# Patient Record
Sex: Male | Born: 1945 | Race: White | Hispanic: No | Marital: Married | Smoking: Never smoker
Health system: Southern US, Community
[De-identification: ages and names within clinical notes are randomized; demographics above are authoritative.]

## PROBLEM LIST (undated history)

## (undated) DIAGNOSIS — C61 Malignant neoplasm of prostate: Secondary | ICD-10-CM

## (undated) DIAGNOSIS — Z950 Presence of cardiac pacemaker: Secondary | ICD-10-CM

## (undated) DIAGNOSIS — R55 Syncope and collapse: Secondary | ICD-10-CM

## (undated) DIAGNOSIS — I10 Essential (primary) hypertension: Secondary | ICD-10-CM

## (undated) DIAGNOSIS — K219 Gastro-esophageal reflux disease without esophagitis: Secondary | ICD-10-CM

## (undated) DIAGNOSIS — E785 Hyperlipidemia, unspecified: Secondary | ICD-10-CM

## (undated) DIAGNOSIS — M1990 Unspecified osteoarthritis, unspecified site: Secondary | ICD-10-CM

## (undated) HISTORY — DX: Syncope and collapse: R55

## (undated) HISTORY — DX: Hyperlipidemia, unspecified: E78.5

## (undated) HISTORY — PX: KNEE ARTHROSCOPY: SUR90

## (undated) HISTORY — PX: INGUINAL HERNIA REPAIR: SUR1180

## (undated) HISTORY — DX: Malignant neoplasm of prostate: C61

## (undated) HISTORY — DX: Gastro-esophageal reflux disease without esophagitis: K21.9

---

## 2008-05-02 ENCOUNTER — Encounter (INDEPENDENT_AMBULATORY_CARE_PROVIDER_SITE_OTHER): Payer: Self-pay | Admitting: Urology

## 2012-12-28 ENCOUNTER — Ambulatory Visit (INDEPENDENT_AMBULATORY_CARE_PROVIDER_SITE_OTHER): Payer: Medicare Other | Admitting: Urology

## 2012-12-28 DIAGNOSIS — N400 Enlarged prostate without lower urinary tract symptoms: Secondary | ICD-10-CM

## 2013-01-04 ENCOUNTER — Other Ambulatory Visit: Payer: Self-pay | Admitting: Urology

## 2013-01-04 DIAGNOSIS — R972 Elevated prostate specific antigen [PSA]: Secondary | ICD-10-CM

## 2013-01-11 ENCOUNTER — Other Ambulatory Visit: Payer: Self-pay | Admitting: Urology

## 2013-01-11 ENCOUNTER — Ambulatory Visit (HOSPITAL_COMMUNITY)
Admission: RE | Admit: 2013-01-11 | Discharge: 2013-01-11 | Disposition: A | Discharge status: Home / Self Care | Payer: Medicare Other | Source: Ambulatory Visit | Attending: Urology | Admitting: Urology

## 2013-01-11 DIAGNOSIS — R972 Elevated prostate specific antigen [PSA]: Secondary | ICD-10-CM

## 2013-01-11 MED ORDER — LIDOCAINE HCL (PF) 2 % IJ SOLN
INTRAMUSCULAR | Status: AC
  Administered 2013-01-11: 10 mL via INTRADERMAL
  Filled 2013-01-11: qty 10

## 2013-01-11 NOTE — Progress Notes (Signed)
Lidocaine 2%       10mL injected                       Transrectal prostate biopsies performed 

## 2013-01-14 ENCOUNTER — Encounter: Payer: Self-pay | Admitting: Urology

## 2013-02-01 ENCOUNTER — Other Ambulatory Visit: Payer: Self-pay | Admitting: Urology

## 2013-02-01 ENCOUNTER — Ambulatory Visit (INDEPENDENT_AMBULATORY_CARE_PROVIDER_SITE_OTHER): Payer: Medicare Other | Admitting: Urology

## 2013-02-01 DIAGNOSIS — C61 Malignant neoplasm of prostate: Secondary | ICD-10-CM

## 2013-02-02 ENCOUNTER — Other Ambulatory Visit: Payer: Self-pay | Admitting: Urology

## 2013-02-02 DIAGNOSIS — C61 Malignant neoplasm of prostate: Secondary | ICD-10-CM

## 2013-03-30 ENCOUNTER — Ambulatory Visit (HOSPITAL_COMMUNITY)
Admission: RE | Admit: 2013-03-30 | Discharge: 2013-03-30 | Disposition: A | Discharge status: Home / Self Care | Payer: Medicare Other | Source: Ambulatory Visit | Attending: Urology | Admitting: Urology

## 2013-03-30 DIAGNOSIS — N433 Hydrocele, unspecified: Secondary | ICD-10-CM | POA: Insufficient documentation

## 2013-03-30 DIAGNOSIS — C61 Malignant neoplasm of prostate: Secondary | ICD-10-CM | POA: Insufficient documentation

## 2013-03-30 LAB — CREATININE, SERUM
Creatinine, Ser: 1.36 mg/dL — ABNORMAL HIGH (ref 0.50–1.35)
GFR calc Af Amer: 60 mL/min — ABNORMAL LOW (ref >90)
GFR calc non Af Amer: 52 mL/min — ABNORMAL LOW (ref >90)

## 2013-03-30 MED ORDER — GADOBENATE DIMEGLUMINE 529 MG/ML IV SOLN
20.0000 mL | Freq: Once | INTRAVENOUS | Status: AC | PRN
  Administered 2013-03-30: 18 mL via INTRAVENOUS

## 2013-04-05 ENCOUNTER — Ambulatory Visit (INDEPENDENT_AMBULATORY_CARE_PROVIDER_SITE_OTHER): Payer: Medicare Other | Admitting: Urology

## 2013-04-05 DIAGNOSIS — C61 Malignant neoplasm of prostate: Secondary | ICD-10-CM

## 2013-08-09 ENCOUNTER — Other Ambulatory Visit: Payer: Self-pay | Admitting: Urology

## 2013-08-09 ENCOUNTER — Ambulatory Visit (INDEPENDENT_AMBULATORY_CARE_PROVIDER_SITE_OTHER): Payer: Medicare Other | Admitting: Urology

## 2013-08-09 DIAGNOSIS — C61 Malignant neoplasm of prostate: Secondary | ICD-10-CM

## 2014-02-07 ENCOUNTER — Ambulatory Visit (HOSPITAL_COMMUNITY)
Admission: RE | Admit: 2014-02-07 | Discharge: 2014-02-07 | Disposition: A | Discharge status: Home / Self Care | Payer: Medicare Other | Source: Ambulatory Visit | Attending: Urology | Admitting: Urology

## 2014-02-07 ENCOUNTER — Other Ambulatory Visit: Payer: Self-pay | Admitting: Urology

## 2014-02-07 ENCOUNTER — Encounter (HOSPITAL_COMMUNITY): Payer: Self-pay

## 2014-02-07 DIAGNOSIS — C61 Malignant neoplasm of prostate: Secondary | ICD-10-CM

## 2014-02-07 HISTORY — DX: Essential (primary) hypertension: I10

## 2014-02-07 MED ORDER — GENTAMICIN SULFATE 40 MG/ML IJ SOLN
INTRAMUSCULAR | Status: AC
  Administered 2014-02-07: 160 mg via INTRAMUSCULAR
  Filled 2014-02-07: qty 4

## 2014-02-07 MED ORDER — LIDOCAINE HCL (PF) 2 % IJ SOLN
10.0000 mL | Freq: Once | INTRAMUSCULAR | Status: AC
  Administered 2014-02-07: 10 mL

## 2014-02-07 MED ORDER — GENTAMICIN SULFATE 40 MG/ML IJ SOLN
160.0000 mg | Freq: Once | INTRAMUSCULAR | Status: AC
  Administered 2014-02-07: 160 mg via INTRAMUSCULAR

## 2014-02-07 MED ORDER — LIDOCAINE HCL (PF) 2 % IJ SOLN
INTRAMUSCULAR | Status: AC
  Administered 2014-02-07: 10 mL
  Filled 2014-02-07: qty 10

## 2014-02-07 NOTE — Discharge Instructions (Signed)
Prostate Biopsy TRUS Biopsy BEFORE THE TEST   Do not take aspirin. Do not take any medicine that has aspirin in it 7 days before your biopsy.  You may be given a medicine to take on the day of your biopsy.  You may also be given a medicine or treatment to help you go poop (laxative or enema). AFTER THE TEST  Only take medicine as told by your doctor.  It is normal to have some bleeding from your rectum for the first 5 days.  You may have blood in your pee (urine) or sperm. Finding out the results of your test Ask when your test results will be ready. Make sure you get your test results. GET HELP RIGHT AWAY IF:  You have a temperature by mouth above 102 F (38.9 C), not controlled by medicine.  You have blood in your pee for more than 5 days.  You have a lot of blood in your pee.  You have bleeding from your rectum for more than 5 days or have a lot of blood in your poop (feces).  You have severe pain. Document Released: 10/01/2009 Document Revised: 01/05/2012 Document Reviewed: 06/01/2013 [facility] Patient Information 2014 [place], [place].

## 2014-02-14 ENCOUNTER — Ambulatory Visit (INDEPENDENT_AMBULATORY_CARE_PROVIDER_SITE_OTHER): Payer: Medicare Other | Admitting: Urology

## 2014-02-14 DIAGNOSIS — C61 Malignant neoplasm of prostate: Secondary | ICD-10-CM

## 2014-03-16 ENCOUNTER — Encounter: Payer: Self-pay | Admitting: Urology

## 2014-08-22 ENCOUNTER — Ambulatory Visit (INDEPENDENT_AMBULATORY_CARE_PROVIDER_SITE_OTHER): Payer: Medicare Other | Admitting: Urology

## 2014-08-22 DIAGNOSIS — N400 Enlarged prostate without lower urinary tract symptoms: Secondary | ICD-10-CM

## 2014-08-22 DIAGNOSIS — C61 Malignant neoplasm of prostate: Secondary | ICD-10-CM

## 2015-02-20 ENCOUNTER — Ambulatory Visit (INDEPENDENT_AMBULATORY_CARE_PROVIDER_SITE_OTHER): Payer: Medicare Other | Admitting: Urology

## 2015-02-20 DIAGNOSIS — C61 Malignant neoplasm of prostate: Secondary | ICD-10-CM

## 2015-08-21 ENCOUNTER — Ambulatory Visit (INDEPENDENT_AMBULATORY_CARE_PROVIDER_SITE_OTHER): Payer: Medicare Other | Admitting: Urology

## 2015-08-21 DIAGNOSIS — C61 Malignant neoplasm of prostate: Secondary | ICD-10-CM | POA: Diagnosis not present

## 2016-02-12 ENCOUNTER — Ambulatory Visit (INDEPENDENT_AMBULATORY_CARE_PROVIDER_SITE_OTHER): Payer: Medicare Other | Admitting: Urology

## 2016-02-12 DIAGNOSIS — N5201 Erectile dysfunction due to arterial insufficiency: Secondary | ICD-10-CM

## 2016-02-12 DIAGNOSIS — C61 Malignant neoplasm of prostate: Secondary | ICD-10-CM

## 2016-07-16 ENCOUNTER — Encounter: Payer: Self-pay | Admitting: *Deleted

## 2016-07-17 ENCOUNTER — Encounter: Payer: Self-pay | Admitting: *Deleted

## 2016-07-17 ENCOUNTER — Ambulatory Visit (INDEPENDENT_AMBULATORY_CARE_PROVIDER_SITE_OTHER): Payer: Medicare Other | Admitting: Cardiovascular Disease

## 2016-07-17 ENCOUNTER — Encounter: Payer: Self-pay | Admitting: Cardiovascular Disease

## 2016-07-17 VITALS — BP 110/82 | HR 64 | Ht 71.0 in | Wt 197.0 lb

## 2016-07-17 DIAGNOSIS — I10 Essential (primary) hypertension: Secondary | ICD-10-CM | POA: Diagnosis not present

## 2016-07-17 DIAGNOSIS — R55 Syncope and collapse: Secondary | ICD-10-CM

## 2016-07-17 DIAGNOSIS — Z9289 Personal history of other medical treatment: Secondary | ICD-10-CM

## 2016-07-17 DIAGNOSIS — Z87898 Personal history of other specified conditions: Secondary | ICD-10-CM | POA: Diagnosis not present
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
## 2016-07-17 NOTE — Patient Instructions (Signed)
Medication Instructions:  Continue all current medications.  Labwork: none  Testing/Procedures:  Your physician has recommended that you wear a 30 day event monitor. Event monitors are medical devices that record the heart's electrical activity. Doctors most often us these monitors to diagnose arrhythmias. Arrhythmias are problems with the speed or rhythm of the heartbeat. The monitor is a small, portable device. You can wear one while you do your normal daily activities. This is usually used to diagnose what is causing palpitations/syncope (passing out).  Office will contact with results via phone or letter.    Follow-Up: 2 months   Any Other Special Instructions Will Be Listed Below (If Applicable).  If you need a refill on your cardiac medications before your next appointment, please call your pharmacy.  

## 2016-07-23 ENCOUNTER — Ambulatory Visit (INDEPENDENT_AMBULATORY_CARE_PROVIDER_SITE_OTHER): Payer: Medicare Other

## 2016-07-23 DIAGNOSIS — R55 Syncope and collapse: Secondary | ICD-10-CM | POA: Diagnosis not present

## 2016-08-12 ENCOUNTER — Ambulatory Visit (INDEPENDENT_AMBULATORY_CARE_PROVIDER_SITE_OTHER): Payer: Medicare Other | Admitting: Urology

## 2016-08-12 DIAGNOSIS — C61 Malignant neoplasm of prostate: Secondary | ICD-10-CM | POA: Diagnosis not present

## 2016-08-29 ENCOUNTER — Telehealth: Payer: Self-pay | Admitting: *Deleted

## 2016-08-29 NOTE — Telephone Encounter (Signed)
Notes Recorded by Laurine Blazer, LPN on [id number] at 624THL PM EDT Patient notified and verbalized understanding. Copy to pmd. ------  Notes Recorded by Arnoldo Lenis, MD on 08/27/2016 at 2:34 PM EDT Normal monitor. Dr Raliegh Ip to discuss at f/u

## 2016-09-11 ENCOUNTER — Encounter: Payer: Self-pay | Admitting: Cardiovascular Disease

## 2016-09-11 ENCOUNTER — Ambulatory Visit (INDEPENDENT_AMBULATORY_CARE_PROVIDER_SITE_OTHER): Payer: Medicare Other | Admitting: Cardiovascular Disease

## 2016-09-11 VITALS — BP 124/98 | HR 59 | Ht 69.0 in | Wt 200.0 lb

## 2016-09-11 DIAGNOSIS — R55 Syncope and collapse: Secondary | ICD-10-CM

## 2016-09-11 DIAGNOSIS — I10 Essential (primary) hypertension: Secondary | ICD-10-CM | POA: Diagnosis not present

## 2016-09-11 NOTE — Progress Notes (Signed)
      SUBJECTIVE: The patient returns for follow-up after undergoing cardiovascular testing performed for the evaluation of syncope. 30 day event monitoring demonstrated sinus rhythm with no significant bradycardia or tachyarrhythmias.  He has not been driving. There have been no further episodes of syncope. He denies chest pain and shortness of breath. He denies headaches. Denies a history of seizures.   Review of Systems: As per "subjective", otherwise negative.  Allergies  Allergen Reactions  . Other     "pain medication thinks its hydrocodone - made me pass out"    Current Outpatient Prescriptions  Medication Sig Dispense Refill  . amLODipine (NORVASC) 5 MG tablet Take 5 mg by mouth daily.    Marland Kitchen atorvastatin (LIPITOR) 20 MG tablet Take 20 mg by mouth daily.    . famotidine (PEPCID) 20 MG tablet Take 20 mg by mouth 2 (two) times daily.    . metoprolol succinate (TOPROL-XL) 25 MG 24 hr tablet Take 12.5 mg by mouth daily.    Marland Kitchen omeprazole (PRILOSEC) 20 MG capsule Take 20 mg by mouth daily.     No current facility-administered medications for this visit.     Past Medical History:  Diagnosis Date  . Hypertension     No past surgical history on file.  Social History   Social History  . Marital status: Married    Spouse name: N/A  . Number of children: N/A  . Years of education: N/A   Occupational History  . Not on file.   Social History Main Topics  . Smoking status: Never Smoker  . Smokeless tobacco: Never Used  . Alcohol use Not on file  . Drug use: Unknown  . Sexual activity: Not on file   Other Topics Concern  . Not on file   Social History Narrative  . No narrative on file     Vitals:   09/11/16 1435  BP: (!) 124/98  Pulse: (!) 59  SpO2: 97%  Weight: 200 lb (90.7 kg)  Height: 5\' 9"  (1.753 m)    PHYSICAL EXAM General: NAD HEENT: Normal. Neck: No JVD, no thyromegaly. Lungs: Clear to auscultation bilaterally with normal respiratory effort. CV:  Nondisplaced PMI.  Regular rate and rhythm, normal S1/S2, no S3/S4, no murmur. No pretibial or periankle edema.  No carotid bruit.   Abdomen: Soft, nontender, no distention.  Neurologic: Alert and oriented.  Psych: Normal affect. Skin: Normal. Musculoskeletal: No gross deformities.    ECG: Most recent ECG reviewed.      ASSESSMENT AND PLAN: 1. Syncope: 30 day event monitoring demonstrated sinus rhythm with no significant bradycardia or tachyarrhythmias. If there are recurrences, would consider implantable loop recorder.  2. HTN: Elevated DBP. Will monitor.  Dispo: fu 4 months.   Kate Sable, M.D., F.A.C.C.

## 2016-09-11 NOTE — Patient Instructions (Signed)
Medication Instructions:  Continue all current medications.  Labwork: none  Testing/Procedures: none  Follow-Up: 4 months   Any Other Special Instructions Will Be Listed Below (If Applicable).  If you need a refill on your cardiac medications before your next appointment, please call your pharmacy.\ 

## 2016-09-22 ENCOUNTER — Ambulatory Visit: Payer: Medicare Other | Admitting: Cardiovascular Disease

## 2017-01-14 ENCOUNTER — Encounter: Payer: Self-pay | Admitting: *Deleted

## 2017-01-14 ENCOUNTER — Ambulatory Visit (INDEPENDENT_AMBULATORY_CARE_PROVIDER_SITE_OTHER): Payer: Medicare Other | Admitting: Cardiovascular Disease

## 2017-01-14 VITALS — BP 132/80 | HR 69 | Ht 71.0 in | Wt 204.0 lb

## 2017-01-14 DIAGNOSIS — I10 Essential (primary) hypertension: Secondary | ICD-10-CM

## 2017-01-14 DIAGNOSIS — R55 Syncope and collapse: Secondary | ICD-10-CM | POA: Diagnosis not present

## 2017-01-14 NOTE — Progress Notes (Signed)
      SUBJECTIVE: The patient presents for follow-up of syncope. 30 day event monitoring demonstrated sinus rhythm with no significant bradycardia or tachyarrhythmias. There have been no further episodes of syncope. He denies chest pain, dizziness, headaches, palpitations, and shortness of breath. He started driving again about a week ago.  Review of Systems: As per "subjective", otherwise negative.  Allergies  Allergen Reactions  . Other     "pain medication thinks its hydrocodone - made me pass out"    Current Outpatient Prescriptions  Medication Sig Dispense Refill  . amLODipine (NORVASC) 5 MG tablet Take 5 mg by mouth daily.    Marland Kitchen atorvastatin (LIPITOR) 20 MG tablet Take 20 mg by mouth daily.    . famotidine (PEPCID) 20 MG tablet Take 20 mg by mouth daily.     . metoprolol succinate (TOPROL-XL) 25 MG 24 hr tablet Take 25 mg by mouth daily.     Marland Kitchen omeprazole (PRILOSEC) 20 MG capsule Take 20 mg by mouth daily.    . Saline (NASOGEL NA) Place 1 spray into the nose daily as needed.     No current facility-administered medications for this visit.     Past Medical History:  Diagnosis Date  . GERD (gastroesophageal reflux disease)   . Hyperlipidemia   . Hypertension   . Prostate cancer [facility])     Past Surgical History:  Procedure Laterality Date  . INGUINAL HERNIA REPAIR Left   . KNEE ARTHROSCOPY Left     Social History   Social History  . Marital status: Married    Spouse name: N/A  . Number of children: N/A  . Years of education: N/A   Occupational History  . Not on file.   Social History Main Topics  . Smoking status: Never Smoker  . Smokeless tobacco: Never Used  . Alcohol use Not on file  . Drug use: Unknown  . Sexual activity: Not on file   Other Topics Concern  . Not on file   Social History Narrative  . No narrative on file     Vitals:   01/14/17 1113  BP: 132/80  Pulse: 69  Weight: 204 lb (92.5 kg)  Height: 5\' 11"  (1.803 m)    PHYSICAL  EXAM General: NAD HEENT: Normal. Neck: No JVD, no thyromegaly. Lungs: Clear to auscultation bilaterally with normal respiratory effort. CV: Nondisplaced PMI.  Regular rate and rhythm, normal S1/S2, no S3/S4, no murmur. No pretibial or periankle edema.  No carotid bruit.   Abdomen: Soft, nontender, no distention.  Neurologic: Alert and oriented.  Psych: Normal affect. Skin: Normal. Musculoskeletal: No gross deformities.    ECG: Most recent ECG reviewed.      ASSESSMENT AND PLAN: 1. Syncope: 30 day event monitoring demonstrated sinus rhythm with no significant bradycardia or tachyarrhythmias. If there are recurrences, would consider implantable loop recorder.  2. HTN: Controlled. No changes.  Dispo: fu 6 months.   Kate Sable, M.D., F.A.C.C.

## 2017-01-14 NOTE — Patient Instructions (Signed)

## 2017-02-03 ENCOUNTER — Ambulatory Visit (INDEPENDENT_AMBULATORY_CARE_PROVIDER_SITE_OTHER): Payer: Medicare Other | Admitting: Urology

## 2017-02-03 DIAGNOSIS — C61 Malignant neoplasm of prostate: Secondary | ICD-10-CM | POA: Diagnosis not present

## 2017-02-12 ENCOUNTER — Telehealth: Payer: Self-pay

## 2017-02-12 NOTE — Telephone Encounter (Signed)
Per patients wife patient was sitting at his computer, felt lightheaded and passed out. Patients wife stated that she tried to check BP right after it happened and machine would not read. Patients wife attempted to check again about  45 minutes later and bp was129/72 HR 63. Patients wife stated that patients color was very grey and he did not look like himself. Advised patients wife that patient needs to be seen in the ER. Patient going to [facility]. Patients wife stated that Dr. Bronson Ing advised if this ever happened again that they needed to contact the office. Routing to Dr. Bronson Ing

## 2017-02-13 ENCOUNTER — Telehealth: Payer: Self-pay

## 2017-02-13 DIAGNOSIS — R55 Syncope and collapse: Secondary | ICD-10-CM

## 2017-02-13 NOTE — Telephone Encounter (Signed)
-----   Message from Weston Anna sent at 02/13/2017 10:57 AM EDT ----- Regarding: Dr Rayann Heman referral Contact: [phone] Dr Scotty Court is asking that Dr Raliegh Ip or a nurse call him back in reference to referring this patient to Dr Rayann Heman.   [phone] Is a backdoor line to get Dr Scotty Court

## 2017-03-16 ENCOUNTER — Encounter: Payer: Self-pay | Admitting: Internal Medicine

## 2017-03-16 ENCOUNTER — Ambulatory Visit (INDEPENDENT_AMBULATORY_CARE_PROVIDER_SITE_OTHER): Payer: Medicare Other | Admitting: Internal Medicine

## 2017-03-16 ENCOUNTER — Ambulatory Visit (HOSPITAL_COMMUNITY)
Admission: RE | Admit: 2017-03-16 | Discharge: 2017-03-16 | Disposition: A | Discharge status: Home / Self Care | Payer: Medicare Other | Source: Ambulatory Visit | Attending: Internal Medicine | Admitting: Internal Medicine

## 2017-03-16 ENCOUNTER — Encounter (HOSPITAL_COMMUNITY)
Admission: RE | Disposition: A | Discharge status: Home / Self Care | Payer: Self-pay | Source: Ambulatory Visit | Attending: Internal Medicine

## 2017-03-16 VITALS — BP 125/73 | HR 71 | Ht 71.0 in | Wt 200.0 lb

## 2017-03-16 DIAGNOSIS — I119 Hypertensive heart disease without heart failure: Secondary | ICD-10-CM | POA: Insufficient documentation

## 2017-03-16 DIAGNOSIS — I10 Essential (primary) hypertension: Secondary | ICD-10-CM

## 2017-03-16 DIAGNOSIS — R55 Syncope and collapse: Secondary | ICD-10-CM

## 2017-03-16 DIAGNOSIS — Z8249 Family history of ischemic heart disease and other diseases of the circulatory system: Secondary | ICD-10-CM | POA: Insufficient documentation

## 2017-03-16 DIAGNOSIS — K219 Gastro-esophageal reflux disease without esophagitis: Secondary | ICD-10-CM | POA: Insufficient documentation

## 2017-03-16 DIAGNOSIS — E785 Hyperlipidemia, unspecified: Secondary | ICD-10-CM | POA: Insufficient documentation

## 2017-03-16 HISTORY — PX: LOOP RECORDER INSERTION: EP1214

## 2017-03-16 SURGERY — LOOP RECORDER INSERTION

## 2017-03-16 MED ORDER — LIDOCAINE-EPINEPHRINE 1 %-1:100000 IJ SOLN
INTRAMUSCULAR | Status: DC | PRN
  Administered 2017-03-16: 30 mL

## 2017-03-16 MED ORDER — LIDOCAINE-EPINEPHRINE 1 %-1:100000 IJ SOLN
INTRAMUSCULAR | Status: AC
  Filled 2017-03-16: qty 1

## 2017-03-16 SURGICAL SUPPLY — 2 items
LOOP REVEAL LINQSYS (Prosthesis & Implant Heart) ×3 IMPLANT
PACK LOOP INSERTION (CUSTOM PROCEDURE TRAY) ×3 IMPLANT
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
## 2017-03-16 NOTE — Patient Instructions (Addendum)
Medication Instructions:   Your physician has recommended you make the following change in your medication:  1) Stop Metoprolol     Labwork: None ordered   Testing/Procedures: LINQ implant---today  Please go to the [place]:  Your physician recommends that you schedule a follow-up appointment in: 10-14 days in device clinic for wound check  Any Other Special Instructions Will Be Listed Below (If Applicable).     If you need a refill on your cardiac medications before your next appointment, please call your pharmacy.
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
## 2017-03-16 NOTE — Progress Notes (Signed)
DR Lovena Le is ready for loop insertion. Consent was given to Dr Lovena Le and he obtained. Vitals were done.

## 2017-03-16 NOTE — Discharge Instructions (Signed)
See supplemental discharge instruction sheet

## 2017-03-17 ENCOUNTER — Encounter (HOSPITAL_COMMUNITY): Payer: Self-pay | Admitting: Internal Medicine

## 2017-03-18 ENCOUNTER — Telehealth: Payer: Self-pay | Admitting: Cardiology

## 2017-03-18 NOTE — Telephone Encounter (Signed)
Patient called and stated that he had LNQ implanted on 03-16-17 and he wanted to make sure his home monitor is working properly. I looked patient up in carelink and informed pt that his home monitor has not updated since 03-16-2017. I asked that patient that following questions.  1. Is the monitor within 4-6 of your bed? Pt stated that it was about 6 ft away from where he slept. I instructed pt to move his monitor closer to where he sleep. That this could be the reason why it had not updated. Pt verbalized understating.   2. Is the monitor near a window? Pt stated no and the window is about 8-10 ft from where he sleeps.   3. Do you get good cell signal in your home? Yes with [employer].   I informed pt that if his home monitor goes more than 14 days without updating someone from the office will call per office protocol. Pt did not seem satisfied with this answer but verbalized understanding.

## 2017-04-01 ENCOUNTER — Ambulatory Visit (INDEPENDENT_AMBULATORY_CARE_PROVIDER_SITE_OTHER): Payer: Self-pay | Admitting: *Deleted

## 2017-04-01 DIAGNOSIS — R55 Syncope and collapse: Secondary | ICD-10-CM

## 2017-04-01 LAB — CUP PACEART INCLINIC DEVICE CHECK
Implantable Pulse Generator Implant Date: 20180521
MDC IDC SESS DTM: 20180606155110

## 2017-04-01 NOTE — Progress Notes (Signed)
Loop wound check in clinic. Steri-strips removed. Incision edges approximated, wound well healed. Battery status: good. R-waves 0.74mV. 2 symptom episodes- 1 episode was instructing about symptom activator s/p implant, other was 03/19/17 when Mark Wolf felt "fluttering"- ECG shows SR with PVCs. Monthly summary reports and ROV with JA in [facility] 07/03/17.

## 2017-04-07 ENCOUNTER — Telehealth: Payer: Self-pay | Admitting: *Deleted

## 2017-04-07 NOTE — Telephone Encounter (Signed)
Manual transmission received 04/01/17, no nightly automatic transmissions received since that time.  Manual transmission sent over the phone today- the monitor then asked him to push the round, grey button again to repeat the process. I gave him the number to [facility] for troubleshooting. He will call back if he has concerns.

## 2017-04-15 ENCOUNTER — Ambulatory Visit (INDEPENDENT_AMBULATORY_CARE_PROVIDER_SITE_OTHER): Payer: Medicare Other | Admitting: *Deleted

## 2017-04-15 DIAGNOSIS — R55 Syncope and collapse: Secondary | ICD-10-CM | POA: Diagnosis not present

## 2017-04-16 NOTE — Progress Notes (Signed)
Carelink Summary Report / Loop Recorder 

## 2017-04-21 LAB — CUP PACEART REMOTE DEVICE CHECK
Date Time Interrogation Session: 20180620160550
MDC IDC PG IMPLANT DT: 20180521

## 2017-04-21 NOTE — Progress Notes (Signed)
Carelink summary report received. Battery status OK. Normal device function. No tachy episodes, brady, or pause episodes. No new AF episodes. 2 symptom- ECGs appear SB/SR w/ PACs. Monthly summary reports and ROV/PRN

## 2017-05-15 ENCOUNTER — Ambulatory Visit (INDEPENDENT_AMBULATORY_CARE_PROVIDER_SITE_OTHER): Payer: Medicare Other | Admitting: *Deleted

## 2017-05-15 DIAGNOSIS — R55 Syncope and collapse: Secondary | ICD-10-CM | POA: Diagnosis not present

## 2017-05-19 NOTE — Progress Notes (Signed)
Carelink Summary Report / Loop Recorder 

## 2017-05-20 ENCOUNTER — Telehealth: Payer: Self-pay | Admitting: Cardiology

## 2017-05-20 ENCOUNTER — Telehealth: Payer: Self-pay | Admitting: *Deleted

## 2017-05-20 NOTE — Telephone Encounter (Signed)
Opened in Error.

## 2017-05-20 NOTE — Telephone Encounter (Signed)
Spoke with patient regarding symptom episode on May 14, 2017, ECG appears SR/ST with PVCs. Advised patient will place episode in Dr. Tanna Furry folder and if further recommendations will call the patient back. Patient verbalized understanding.

## 2017-05-20 NOTE — Telephone Encounter (Signed)
Patient called and stated that on Thursday May 14, 2017 he felt dizzy, and light headed like he was going to pass out. He did not pass out, and he used his symptom activator. Call forwarded to [place].

## 2017-05-31 LAB — CUP PACEART REMOTE DEVICE CHECK
Date Time Interrogation Session: 20180720201105
Implantable Pulse Generator Implant Date: 20180521

## 2017-05-31 NOTE — Progress Notes (Signed)
Carelink summary report received. Battery status OK. Normal device function. No tachy episodes, brady, or pause episodes. No new AF episodes. 1 symptom- ECG appears SR w/ PVCs. Monthly summary reports and ROV/PRN

## 2017-06-10 ENCOUNTER — Other Ambulatory Visit: Payer: Self-pay | Admitting: Internal Medicine

## 2017-06-15 ENCOUNTER — Ambulatory Visit (INDEPENDENT_AMBULATORY_CARE_PROVIDER_SITE_OTHER): Payer: Medicare Other | Admitting: *Deleted

## 2017-06-15 DIAGNOSIS — R55 Syncope and collapse: Secondary | ICD-10-CM | POA: Diagnosis not present

## 2017-06-16 NOTE — Progress Notes (Signed)
Carelink Summary Report / Loop Recorder 

## 2017-06-21 LAB — CUP PACEART REMOTE DEVICE CHECK
Implantable Pulse Generator Implant Date: 20180521
MDC IDC SESS DTM: 20180819203831

## 2017-07-03 ENCOUNTER — Encounter: Payer: Self-pay | Admitting: Internal Medicine

## 2017-07-03 ENCOUNTER — Ambulatory Visit (INDEPENDENT_AMBULATORY_CARE_PROVIDER_SITE_OTHER): Payer: Medicare Other | Admitting: Internal Medicine

## 2017-07-03 VITALS — BP 138/82 | HR 77 | Ht 71.0 in | Wt 194.0 lb

## 2017-07-03 DIAGNOSIS — I10 Essential (primary) hypertension: Secondary | ICD-10-CM | POA: Diagnosis not present

## 2017-07-03 DIAGNOSIS — R55 Syncope and collapse: Secondary | ICD-10-CM | POA: Diagnosis not present

## 2017-07-03 LAB — CUP PACEART INCLINIC DEVICE CHECK
Date Time Interrogation Session: 20180907123100
MDC IDC PG IMPLANT DT: 20180521

## 2017-07-03 NOTE — Patient Instructions (Addendum)
Medication Instructions:    Your physician recommends that you continue on your current medications as directed. Please refer to the Current Medication list given to you today.  Labwork:  NONE  Testing/Procedures:  NONE  Follow-Up: Your physician recommends that you schedule a follow-up appointment in: 1 year with [facility]. Please schedule this appointment today before leaving the office.  Any Other Special Instructions Will Be Listed Below (If Applicable).  Your next check on your recorder is in December 2018.    If you need a refill on your cardiac medications before your next appointment, please call your pharmacy.

## 2017-07-03 NOTE — Progress Notes (Signed)
   PCP: Deloria Lair., MD Primary Cardiologist: Dr Bronson Ing Primary EP: Dr Rayann Heman  Mark Wolf is a 71 y.o. male who presents today for routine electrophysiology followup.  Since last being seen in our clinic, the patient reports doing very well.   No further syncope.  Today, he denies symptoms of palpitations, chest pain, shortness of breath,  lower extremity edema, dizziness, presyncope, or syncope.  The patient is otherwise without complaint today.   Past Medical History:  Diagnosis Date  . GERD (gastroesophageal reflux disease)   . Hyperlipidemia   . Hypertension   . Prostate cancer [facility])    Past Surgical History:  Procedure Laterality Date  . INGUINAL HERNIA REPAIR Left   . KNEE ARTHROSCOPY Left   . LOOP RECORDER INSERTION N/A 03/16/2017   Procedure: Loop Recorder Insertion;  Surgeon: Evans Lance, MD;  Location: [place] [facility];  Service: Cardiovascular;  Laterality: N/A;    ROS- all systems are reviewed and negatives except as per HPI above  Current Outpatient Prescriptions  Medication Sig Dispense Refill  . amLODipine (NORVASC) 5 MG tablet Take 5 mg by mouth daily.    Marland Kitchen atorvastatin (LIPITOR) 20 MG tablet Take 20 mg by mouth daily.    . famotidine (PEPCID) 20 MG tablet Take 20 mg by mouth daily.     Marland Kitchen omeprazole (PRILOSEC) 20 MG capsule Take 20 mg by mouth daily.    . Saline (NASOGEL NA) Place 1 spray into the nose 2 (two) times daily.      No current facility-administered medications for this visit.     Physical Exam: Vitals:   07/03/17 1010  BP: 138/82  Pulse: 77  SpO2: 98%  Weight: 194 lb (88 kg)  Height: 5\' 11"  (1.803 m)    GEN- The patient is well appearing, alert and oriented x 3 today.   Head- normocephalic, atraumatic Eyes-  Sclera clear, conjunctiva pink Ears- hearing intact Oropharynx- clear Lungs- Clear to ausculation bilaterally, normal work of breathing Heart- Regular rate and rhythm, no murmurs, rubs or gallops, PMI not laterally  displaced GI- soft, NT, ND, + BS Extremities- no clubbing, cyanosis, or edema   Assessment and Plan:  1. Recurrent unexplained syncope Doing well No further episodes Driving restrictions for syncope (no driving x 6 months per DMV) again reviewed today.  He can resume driving after 46/65/99 ILR interrogation today reveals no arrhythmias.  2. Hypertensive cardiovascular disease Stable No change required today  carelink Return to see me in a year  Thompson Grayer MD, [facility] 07/03/2017 10:43 AM

## 2017-07-14 ENCOUNTER — Ambulatory Visit (INDEPENDENT_AMBULATORY_CARE_PROVIDER_SITE_OTHER): Payer: Medicare Other | Admitting: *Deleted

## 2017-07-14 DIAGNOSIS — R55 Syncope and collapse: Secondary | ICD-10-CM | POA: Diagnosis not present

## 2017-07-15 NOTE — Progress Notes (Signed)
Carelink Summary Report / Loop Recorder 

## 2017-07-16 LAB — CUP PACEART REMOTE DEVICE CHECK
Date Time Interrogation Session: 20180918213920
MDC IDC PG IMPLANT DT: 20180521

## 2017-08-11 ENCOUNTER — Ambulatory Visit (INDEPENDENT_AMBULATORY_CARE_PROVIDER_SITE_OTHER): Payer: Medicare Other | Admitting: Urology

## 2017-08-11 DIAGNOSIS — C61 Malignant neoplasm of prostate: Secondary | ICD-10-CM

## 2017-08-13 ENCOUNTER — Ambulatory Visit (INDEPENDENT_AMBULATORY_CARE_PROVIDER_SITE_OTHER): Payer: Medicare Other | Admitting: *Deleted

## 2017-08-13 DIAGNOSIS — R55 Syncope and collapse: Secondary | ICD-10-CM

## 2017-08-14 LAB — CUP PACEART REMOTE DEVICE CHECK
Implantable Pulse Generator Implant Date: 20180521
MDC IDC SESS DTM: 20181018221127

## 2017-08-14 NOTE — Progress Notes (Signed)
Carelink Summary Report / Loop Recorder 

## 2017-09-14 ENCOUNTER — Ambulatory Visit (INDEPENDENT_AMBULATORY_CARE_PROVIDER_SITE_OTHER): Payer: Medicare Other | Admitting: *Deleted

## 2017-09-14 DIAGNOSIS — R55 Syncope and collapse: Secondary | ICD-10-CM

## 2017-09-14 NOTE — Progress Notes (Signed)
Carelink Summary Report / Loop Recorder 

## 2017-09-22 ENCOUNTER — Telehealth: Payer: Self-pay | Admitting: *Deleted

## 2017-09-22 NOTE — Telephone Encounter (Signed)
Spoke with patient regarding 7 tachy episodes. ECG appears ST-- all after 7 pm, Max V rate appears 167 bpm, longest 52 seconds. Patient states no symptoms associated with episodes. Patient states he was playing racket ball on those days at that time, he plays every Tuesday and Thursday evening starting at 7 pm. Advised patient to call the [facility] if any symptoms associated with syncope or presyncope. Patient verbalized understanding.

## 2017-09-28 LAB — CUP PACEART REMOTE DEVICE CHECK
Date Time Interrogation Session: 20181117234024
Implantable Pulse Generator Implant Date: 20180521

## 2017-10-12 ENCOUNTER — Ambulatory Visit (INDEPENDENT_AMBULATORY_CARE_PROVIDER_SITE_OTHER): Payer: Medicare Other | Admitting: *Deleted

## 2017-10-12 DIAGNOSIS — R55 Syncope and collapse: Secondary | ICD-10-CM

## 2017-10-13 NOTE — Progress Notes (Signed)
Carelink Summary Report / Loop Recorder 

## 2017-10-22 LAB — CUP PACEART REMOTE DEVICE CHECK
Date Time Interrogation Session: 20181218004003
MDC IDC PG IMPLANT DT: 20180521

## 2017-11-11 ENCOUNTER — Other Ambulatory Visit: Payer: Self-pay | Admitting: Internal Medicine

## 2017-11-11 ENCOUNTER — Ambulatory Visit (INDEPENDENT_AMBULATORY_CARE_PROVIDER_SITE_OTHER): Payer: Medicare Other | Admitting: *Deleted

## 2017-11-11 DIAGNOSIS — R55 Syncope and collapse: Secondary | ICD-10-CM

## 2017-11-12 NOTE — Progress Notes (Signed)
Carelink Summary Report / Loop Recorder 

## 2017-11-20 LAB — CUP PACEART REMOTE DEVICE CHECK
Date Time Interrogation Session: 20190117011519
MDC IDC PG IMPLANT DT: 20180521

## 2017-12-03 ENCOUNTER — Telehealth: Payer: Self-pay | Admitting: *Deleted

## 2017-12-03 NOTE — Telephone Encounter (Signed)
Manual transmission received. Spoke with patient regarding episode. ECG appears SR to SB of 30's back to SR. Advised patient we will review with Dr. Rayann Heman when he is back in the office next week and call back.

## 2017-12-03 NOTE — Telephone Encounter (Signed)
Spoke with patient regarding symptom episode on Jan. 30th, 2019 at 8:17. Patient states he was sitting in the chair when he became very "woozy." Advised patient to send manual transmission. Patient received error code [id number]. Advised patient to place remote in receiver for about 20 minutes and call back for assistance on sending manual transmission. Patient verbalized understanding.

## 2017-12-04 NOTE — Telephone Encounter (Signed)
Reviewed symptom episode ECG with Chanetta Marshall, NP.  She offered appointment today if patient is able to come to the [facility] office.  Otherwise, plan to review with Dr. Rayann Heman next week for further recommendations.  Spoke with patient.  He states that he has plans today and is unable to come to the [facility] office for an appointment.  Patient requests appointment with Dr. Rayann Heman in [place], but advised this may not be possible as his schedule is booked until May.  Patient reports that he has had these issues for over a year and this is the first time the loop recorder has noted something (previous symptom episode ECGs show SR w/PACs and PVCs).  Patient is scheduled for appointment to discuss possible hernia surgery with Dr. Arnoldo Morale on 12/08/17.    Advised patient that if he has a syncopal episode over the weekend, he should proceed to ED and not drive himself.  Advised I will discuss episode with Dr. Rayann Heman and call him back on Monday.  Patient verbalizes understanding and agreement with plan.

## 2017-12-06 ENCOUNTER — Other Ambulatory Visit: Payer: Self-pay | Admitting: *Deleted

## 2017-12-06 ENCOUNTER — Other Ambulatory Visit: Payer: Self-pay | Admitting: Urology

## 2017-12-06 DIAGNOSIS — C61 Malignant neoplasm of prostate: Secondary | ICD-10-CM

## 2017-12-07 NOTE — Telephone Encounter (Signed)
Spoke with patient to offer him an appointment with Dr. Rayann Heman.  He states he is not home at this time and will call back later.  Instructed him to call the main office number and ask for the [facility] due to him not being able to write our number down at this time.

## 2017-12-07 NOTE — Telephone Encounter (Signed)
Spoke with patient.  He is agreeable to appointment on Thursday, 12/10/17 at 9:00am with Dr. Rayann Heman at the [facility] office.  Patient is aware of office address.  He denies additional questions or concerns at this time.

## 2017-12-08 ENCOUNTER — Ambulatory Visit (INDEPENDENT_AMBULATORY_CARE_PROVIDER_SITE_OTHER): Payer: Medicare Other | Admitting: General Surgery

## 2017-12-08 ENCOUNTER — Encounter: Payer: Self-pay | Admitting: General Surgery

## 2017-12-08 VITALS — BP 142/84 | HR 79 | Temp 98.0°F | Ht 71.0 in | Wt 194.0 lb

## 2017-12-08 DIAGNOSIS — K4090 Unilateral inguinal hernia, without obstruction or gangrene, not specified as recurrent: Secondary | ICD-10-CM | POA: Diagnosis not present

## 2017-12-08 NOTE — Progress Notes (Addendum)
Mark Wolf; [id number]; 12-18-1945   HPI Patient is a 72 year old white male who was referred to my care by Suann Larry for evaluation and treatment of a right inguinal hernia.  The patient has had worsening right groin pain and swelling over the past few weeks.  It is made worse with straining or coughing.  His pain level is 2 out of 10.  He is also currently being worked up for sinus bradycardia and will be seen cardiology in a few days.  He does have a loop recorder present.  He denies any nausea or vomiting. Past Medical History:  Diagnosis Date  . GERD (gastroesophageal reflux disease)   . Hyperlipidemia   . Hypertension   . Prostate cancer ([place])   . Syncope    recurrent, unexplained,  implantable loop recorder placed    Past Surgical History:  Procedure Laterality Date  . INGUINAL HERNIA REPAIR Left   . KNEE ARTHROSCOPY Left   . LOOP RECORDER INSERTION N/A 03/16/2017   MDT LINQ implanted for recurrent unexplained syncope    Family History  Problem Relation Age of Onset  . Aneurysm Mother   . Heart failure Father     Current Outpatient Medications on File Prior to Visit  Medication Sig Dispense Refill  . amLODipine (NORVASC) 5 MG tablet Take 5 mg by mouth daily.    Marland Kitchen atorvastatin (LIPITOR) 20 MG tablet Take 20 mg by mouth daily.    . famotidine (PEPCID) 20 MG tablet Take 20 mg by mouth daily.     Marland Kitchen omeprazole (PRILOSEC) 20 MG capsule Take 20 mg by mouth daily.    . Saline (NASOGEL NA) Place 1 spray into the nose 2 (two) times daily.      No current facility-administered medications on file prior to visit.     Allergies  Allergen Reactions  . Other     "pain medication thinks its hydrocodone - made me pass out"  . Oxycodone     Eyes rolled back in head    Social History   Substance and Sexual Activity  Alcohol Use No    Social History   Tobacco Use  Smoking Status Never Smoker  Smokeless Tobacco Never Used    Review of Systems  Constitutional:  Negative.   HENT: Negative.   Eyes: Negative.   Respiratory: Negative.   Cardiovascular: Negative.   Gastrointestinal: Negative.   Genitourinary: Negative.   Musculoskeletal: Negative.   Skin: Negative.   Neurological: Positive for dizziness.  Endo/Heme/Allergies: Negative.   Psychiatric/Behavioral: Negative.     Objective   Vitals:   12/08/17 1314  BP: (!) 142/84  Pulse: 79  Temp: 98 F (36.7 C)    Physical Exam  Constitutional: He is oriented to person, place, and time and well-developed, well-nourished, and in no distress.  HENT:  Head: Normocephalic and atraumatic.  Cardiovascular: Normal rate, regular rhythm and normal heart sounds. Exam reveals no gallop and no friction rub.  No murmur heard. Pulmonary/Chest: Effort normal and breath sounds normal. No respiratory distress. He has no wheezes. He has no rales.  Abdominal: Soft. Bowel sounds are normal. He exhibits no distension. There is no tenderness. There is no rebound.  Easily reducible right inguinal hernia  Genitourinary:  Genitourinary Comments: Testicles within normal limits.  Neurological: He is alert and oriented to person, place, and time.  Skin: Skin is warm and dry.  Vitals reviewed.   Assessment  Right inguinal hernia, symptomatic History of sinus bradycardia Plan   Patient  will be scheduled for right inguinal herniorrhaphy with mesh once he has been cleared by cardiology.  The risks and benefits of the procedure including bleeding, infection, mesh use, and the possibility of recurrence of the hernia were fully explained to the patient, who gave informed consent.

## 2017-12-08 NOTE — Patient Instructions (Signed)

## 2017-12-10 ENCOUNTER — Encounter: Payer: Self-pay | Admitting: Internal Medicine

## 2017-12-10 ENCOUNTER — Ambulatory Visit (INDEPENDENT_AMBULATORY_CARE_PROVIDER_SITE_OTHER): Payer: Medicare Other | Admitting: Internal Medicine

## 2017-12-10 VITALS — BP 130/80 | HR 76 | Ht 71.0 in | Wt 195.4 lb

## 2017-12-10 DIAGNOSIS — I10 Essential (primary) hypertension: Secondary | ICD-10-CM

## 2017-12-10 DIAGNOSIS — R001 Bradycardia, unspecified: Secondary | ICD-10-CM | POA: Diagnosis not present

## 2017-12-10 DIAGNOSIS — R55 Syncope and collapse: Secondary | ICD-10-CM

## 2017-12-10 LAB — CUP PACEART INCLINIC DEVICE CHECK
MDC IDC PG IMPLANT DT: 20180521
MDC IDC SESS DTM: 20190214102030

## 2017-12-10 NOTE — H&P (Signed)
Mark Wolf; [id number]; 11/01/1945   HPI Patient is a 72 year old white male who was referred to my care by Suann Larry for evaluation and treatment of a right inguinal hernia.  The patient has had worsening right groin pain and swelling over the past few weeks.  It is made worse with straining or coughing.  His pain level is 2 out of 10.  He is also currently being worked up for sinus bradycardia and will be seen cardiology in a few days.  He does have a loop recorder present.  He denies any nausea or vomiting. Past Medical History:  Diagnosis Date  . GERD (gastroesophageal reflux disease)   . Hyperlipidemia   . Hypertension   . Prostate cancer ([place])   . Syncope    recurrent, unexplained,  implantable loop recorder placed    Past Surgical History:  Procedure Laterality Date  . INGUINAL HERNIA REPAIR Left   . KNEE ARTHROSCOPY Left   . LOOP RECORDER INSERTION N/A 03/16/2017   MDT LINQ implanted for recurrent unexplained syncope    Family History  Problem Relation Age of Onset  . Aneurysm Mother   . Heart failure Father     Current Outpatient Medications on File Prior to Visit  Medication Sig Dispense Refill  . amLODipine (NORVASC) 5 MG tablet Take 5 mg by mouth daily.    Marland Kitchen atorvastatin (LIPITOR) 20 MG tablet Take 20 mg by mouth daily.    . famotidine (PEPCID) 20 MG tablet Take 20 mg by mouth daily.     Marland Kitchen omeprazole (PRILOSEC) 20 MG capsule Take 20 mg by mouth daily.    . Saline (NASOGEL NA) Place 1 spray into the nose 2 (two) times daily.      No current facility-administered medications on file prior to visit.     Allergies  Allergen Reactions  . Other     "pain medication thinks its hydrocodone - made me pass out"  . Oxycodone     Eyes rolled back in head    Social History   Substance and Sexual Activity  Alcohol Use No    Social History   Tobacco Use  Smoking Status Never Smoker  Smokeless Tobacco Never Used    Review of Systems  Constitutional:  Negative.   HENT: Negative.   Eyes: Negative.   Respiratory: Negative.   Cardiovascular: Negative.   Gastrointestinal: Negative.   Genitourinary: Negative.   Musculoskeletal: Negative.   Skin: Negative.   Neurological: Positive for dizziness.  Endo/Heme/Allergies: Negative.   Psychiatric/Behavioral: Negative.     Objective   Vitals:   12/08/17 1314  BP: (!) 142/84  Pulse: 79  Temp: 98 F (36.7 C)    Physical Exam  Constitutional: He is oriented to person, place, and time and well-developed, well-nourished, and in no distress.  HENT:  Head: Normocephalic and atraumatic.  Cardiovascular: Normal rate, regular rhythm and normal heart sounds. Exam reveals no gallop and no friction rub.  No murmur heard. Pulmonary/Chest: Effort normal and breath sounds normal. No respiratory distress. He has no wheezes. He has no rales.  Abdominal: Soft. Bowel sounds are normal. He exhibits no distension. There is no tenderness. There is no rebound.  Easily reducible right inguinal hernia  Genitourinary:  Genitourinary Comments: Testicles within normal limits.  Neurological: He is alert and oriented to person, place, and time.  Skin: Skin is warm and dry.  Vitals reviewed.   Assessment  Right inguinal hernia, symptomatic History of sinus bradycardia Plan   Patient  will be scheduled for right inguinal herniorrhaphy with mesh once he has been cleared by cardiology.  The risks and benefits of the procedure including bleeding, infection, mesh use, and the possibility of recurrence of the hernia were fully explained to the patient, who gave informed consent. Addendum:  Patient has been cleared by Cardiology to proceed with surgery.

## 2017-12-10 NOTE — H&P (View-Only) (Signed)
PCP: Deloria Lair., MD   Primary EP: Dr Nanetta Batty is a 72 y.o. male who presents today for routine electrophysiology followup.  Since last being seen in our clinic, the patient reports doing very well.  He did have an episode of transient presyncope 11/25/2017 at 20:17.  He reports transient presyncope at rest, lasting only several seconds without cause.  He has done well since without any additional ssiues. Today, he denies symptoms of palpitations, chest pain, shortness of breath,  lower extremity edema,  or syncope.  The patient is otherwise without complaint today.   Past Medical History:  Diagnosis Date  . GERD (gastroesophageal reflux disease)   . Hyperlipidemia   . Hypertension   . Prostate cancer ([place])   . Syncope    recurrent, unexplained,  implantable loop recorder placed   Past Surgical History:  Procedure Laterality Date  . INGUINAL HERNIA REPAIR Left   . KNEE ARTHROSCOPY Left   . LOOP RECORDER INSERTION N/A 03/16/2017   MDT LINQ implanted for recurrent unexplained syncope    ROS- all systems are reviewed and negatives except as per HPI above  Current Outpatient Medications  Medication Sig Dispense Refill  . amLODipine (NORVASC) 5 MG tablet Take 5 mg by mouth daily.    Marland Kitchen atorvastatin (LIPITOR) 20 MG tablet Take 20 mg by mouth daily.    . famotidine (PEPCID) 20 MG tablet Take 20 mg by mouth daily.     Marland Kitchen omeprazole (PRILOSEC) 20 MG capsule Take 20 mg by mouth daily.    . Saline (NASOGEL NA) Place 1 spray into the nose 2 (two) times daily.      No current facility-administered medications for this visit.     Physical Exam: Vitals:   12/10/17 0852  BP: 130/80  Pulse: 76  Weight: 195 lb 6.4 oz (88.6 kg)  Height: 5\' 11"  (1.803 m)    GEN- The patient is well appearing, alert and oriented x 3 today.   Head- normocephalic, atraumatic Eyes-  Sclera clear, conjunctiva pink Ears- hearing intact Oropharynx- clear Lungs- Clear to ausculation  bilaterally, normal work of breathing Heart- Regular rate and rhythm, no murmurs, rubs or gallops, PMI not laterally displaced GI- soft, NT, ND, + BS Extremities- no clubbing, cyanosis, or edema  EKG tracing ordered today is personally reviewed and shows sinus rhythm, normal ekg ILR is reviewed and reveals that on 11/25/17 he has transient sinus bradycardia with heart rates 30s.  No AV block or prolonged pauses  Assessment and Plan:  1. Recurrent syncope Unclear etilology, though recent presyncope was secondary to sinus bradycardia. No reversible causes have been found.  He meets criteria for PPM implantation. Risks, benefits, alternatives to pacemaker implantation were discussed in detail with the patient today. The patient understands that the risks include but are not limited to bleeding, infection, pneumothorax, perforation, tamponade, vascular damage, renal failure, MI, stroke, death,  and lead dislodgement and wishes to think about this further.  If he has additional episodes, he may be more inclined to proceed. Certainly, if he has further syncope, PPM would be warranted.  2. HTN Stable No change required today  3. preop Plans for hernia repair were discussed today.  OK to proceed without additional CV testing or pacing at this time.  [facility] Return to see me in a year He will contact my office if he decides to proceed with PPM or has further symptoms in the interim  Thompson Grayer MD, [facility] 12/10/2017 9:16  AM

## 2017-12-10 NOTE — Progress Notes (Signed)
Patient seen in Dr Jackalyn Lombard office today.  See note.  Discussed with Dr. Patsey Berthold, who advised that PPM was priority over the elective hernia repair.  Dr Patsey Berthold spoke with Dr Arnoldo Morale and reviewed case with him.  Case to be postponed until PPM inserted.

## 2017-12-10 NOTE — Progress Notes (Signed)
PCP: Deloria Lair., MD   Primary EP: Dr Nanetta Batty is a 72 y.o. male who presents today for routine electrophysiology followup.  Since last being seen in our clinic, the patient reports doing very well.  He did have an episode of transient presyncope 11/25/2017 at 20:17.  He reports transient presyncope at rest, lasting only several seconds without cause.  He has done well since without any additional ssiues. Today, he denies symptoms of palpitations, chest pain, shortness of breath,  lower extremity edema,  or syncope.  The patient is otherwise without complaint today.   Past Medical History:  Diagnosis Date  . GERD (gastroesophageal reflux disease)   . Hyperlipidemia   . Hypertension   . Prostate cancer ([place])   . Syncope    recurrent, unexplained,  implantable loop recorder placed   Past Surgical History:  Procedure Laterality Date  . INGUINAL HERNIA REPAIR Left   . KNEE ARTHROSCOPY Left   . LOOP RECORDER INSERTION N/A 03/16/2017   MDT LINQ implanted for recurrent unexplained syncope    ROS- all systems are reviewed and negatives except as per HPI above  Current Outpatient Medications  Medication Sig Dispense Refill  . amLODipine (NORVASC) 5 MG tablet Take 5 mg by mouth daily.    Marland Kitchen atorvastatin (LIPITOR) 20 MG tablet Take 20 mg by mouth daily.    . famotidine (PEPCID) 20 MG tablet Take 20 mg by mouth daily.     Marland Kitchen omeprazole (PRILOSEC) 20 MG capsule Take 20 mg by mouth daily.    . Saline (NASOGEL NA) Place 1 spray into the nose 2 (two) times daily.      No current facility-administered medications for this visit.     Physical Exam: Vitals:   12/10/17 0852  BP: 130/80  Pulse: 76  Weight: 195 lb 6.4 oz (88.6 kg)  Height: 5\' 11"  (1.803 m)    GEN- The patient is well appearing, alert and oriented x 3 today.   Head- normocephalic, atraumatic Eyes-  Sclera clear, conjunctiva pink Ears- hearing intact Oropharynx- clear Lungs- Clear to ausculation  bilaterally, normal work of breathing Heart- Regular rate and rhythm, no murmurs, rubs or gallops, PMI not laterally displaced GI- soft, NT, ND, + BS Extremities- no clubbing, cyanosis, or edema  EKG tracing ordered today is personally reviewed and shows sinus rhythm, normal ekg ILR is reviewed and reveals that on 11/25/17 he has transient sinus bradycardia with heart rates 30s.  No AV block or prolonged pauses  Assessment and Plan:  1. Recurrent syncope Unclear etilology, though recent presyncope was secondary to sinus bradycardia. No reversible causes have been found.  He meets criteria for PPM implantation. Risks, benefits, alternatives to pacemaker implantation were discussed in detail with the patient today. The patient understands that the risks include but are not limited to bleeding, infection, pneumothorax, perforation, tamponade, vascular damage, renal failure, MI, stroke, death,  and lead dislodgement and wishes to think about this further.  If he has additional episodes, he may be more inclined to proceed. Certainly, if he has further syncope, PPM would be warranted.  2. HTN Stable No change required today  3. preop Plans for hernia repair were discussed today.  OK to proceed without additional CV testing or pacing at this time.  Carelink Return to see me in a year He will contact my office if he decides to proceed with PPM or has further symptoms in the interim  Thompson Grayer MD, [facility] 12/10/2017 9:16  AM

## 2017-12-10 NOTE — Patient Instructions (Signed)

## 2017-12-11 ENCOUNTER — Encounter (HOSPITAL_COMMUNITY)
Admission: RE | Admit: 2017-12-11 | Discharge: 2017-12-11 | Disposition: A | Discharge status: Home / Self Care | Payer: Medicare Other | Source: Ambulatory Visit | Attending: General Surgery | Admitting: General Surgery

## 2017-12-11 ENCOUNTER — Telehealth: Payer: Self-pay | Admitting: Internal Medicine

## 2017-12-11 ENCOUNTER — Ambulatory Visit (INDEPENDENT_AMBULATORY_CARE_PROVIDER_SITE_OTHER): Payer: Medicare Other | Admitting: *Deleted

## 2017-12-11 DIAGNOSIS — R55 Syncope and collapse: Secondary | ICD-10-CM

## 2017-12-11 DIAGNOSIS — R001 Bradycardia, unspecified: Secondary | ICD-10-CM

## 2017-12-11 NOTE — Telephone Encounter (Signed)
Patient called to inform Dr. Rayann Heman that he is ready to proceed with the PPM placement. Patient said he spoke with Arnoldo Morale Sales promotion account executive) on yesterday about his hernia surgery and he was informed that per the anesthesiologist, he could not have the hernia surgery until after the PPM was placed. Patient advised that this message would be sent to Dr. Rayann Heman.

## 2017-12-11 NOTE — Telephone Encounter (Signed)
New message  Pt verbalized that he is calling for Dr.Allred  Pt did not want to disclose the reason, and did not want a RN  I kindly apologized and informed him that Dr.Allred is out and that a Triage RN will call back

## 2017-12-14 ENCOUNTER — Ambulatory Visit: Admit: 2017-12-14 | Payer: Medicare Other | Admitting: General Surgery

## 2017-12-14 SURGERY — REPAIR, HERNIA, INGUINAL, ADULT
Anesthesia: General | Laterality: Right

## 2017-12-14 NOTE — Progress Notes (Signed)
Carelink Summary Report / Loop Recorder 

## 2017-12-15 NOTE — Telephone Encounter (Signed)
Patient returning call b/c he is ready to proceeded with the PPM placement. Informed pt that MD is out of the office until Monday. Informed him that a RN may be able to help get it scheduled and I would have someone call him back. Pt verbalized understanding.

## 2017-12-16 ENCOUNTER — Other Ambulatory Visit: Payer: Medicare Other | Admitting: *Deleted

## 2017-12-16 DIAGNOSIS — R001 Bradycardia, unspecified: Secondary | ICD-10-CM

## 2017-12-16 DIAGNOSIS — R55 Syncope and collapse: Secondary | ICD-10-CM

## 2017-12-16 NOTE — Telephone Encounter (Signed)
Returned call to Pt.  Pt would like soonest available procedure time, cannot have surgery until PPM placed. Pt scheduled for December 22 2017 @ 1530 with Dr. Rayann Heman.  Pt will come for labs today and pick up instruction letter and scrub.

## 2017-12-17 LAB — BASIC METABOLIC PANEL
BUN / CREAT RATIO: 13 (ref 10–24)
BUN: 16 mg/dL (ref 8–27)
CHLORIDE: 103 mmol/L (ref 96–106)
CO2: 24 mmol/L (ref 20–29)
Calcium: 9.8 mg/dL (ref 8.6–10.2)
Creatinine, Ser: 1.26 mg/dL (ref 0.76–1.27)
GFR calc non Af Amer: 56 mL/min/{1.73_m2} — ABNORMAL LOW (ref >59)
GFR, EST AFRICAN AMERICAN: 65 mL/min/{1.73_m2} (ref >59)
Glucose: 82 mg/dL (ref 65–99)
Potassium: 4.8 mmol/L (ref 3.5–5.2)
Sodium: 143 mmol/L (ref 134–144)

## 2017-12-17 LAB — CBC WITH DIFFERENTIAL/PLATELET
BASOS ABS: 0 10*3/uL (ref 0.0–0.2)
Basos: 0 %
EOS (ABSOLUTE): 0.1 10*3/uL (ref 0.0–0.4)
EOS: 1 %
HEMATOCRIT: 50.8 % (ref 37.5–51.0)
HEMOGLOBIN: 17.7 g/dL (ref 13.0–17.7)
Immature Grans (Abs): 0 10*3/uL (ref 0.0–0.1)
Immature Granulocytes: 0 %
LYMPHS ABS: 2.2 10*3/uL (ref 0.7–3.1)
Lymphs: 30 %
MCH: 31.6 pg (ref 26.6–33.0)
MCHC: 34.8 g/dL (ref 31.5–35.7)
MCV: 91 fL (ref 79–97)
MONOCYTES: 8 %
Monocytes Absolute: 0.6 10*3/uL (ref 0.1–0.9)
NEUTROS ABS: 4.4 10*3/uL (ref 1.4–7.0)
Neutrophils: 61 %
Platelets: 232 10*3/uL (ref 150–379)
RBC: 5.61 x10E6/uL (ref 4.14–5.80)
RDW: 13.9 % (ref 12.3–15.4)
WBC: 7.3 10*3/uL (ref 3.4–10.8)

## 2017-12-22 ENCOUNTER — Encounter (HOSPITAL_COMMUNITY)
Admission: RE | Disposition: A | Discharge status: Home / Self Care | Payer: Self-pay | Source: Ambulatory Visit | Attending: Internal Medicine

## 2017-12-22 ENCOUNTER — Ambulatory Visit (HOSPITAL_COMMUNITY)
Admission: RE | Admit: 2017-12-22 | Discharge: 2017-12-23 | Disposition: A | Discharge status: Home / Self Care | Payer: Medicare Other | Source: Ambulatory Visit | Attending: Internal Medicine | Admitting: Internal Medicine

## 2017-12-22 ENCOUNTER — Other Ambulatory Visit: Payer: Self-pay

## 2017-12-22 ENCOUNTER — Encounter (HOSPITAL_COMMUNITY): Payer: Self-pay | Admitting: Cardiology

## 2017-12-22 DIAGNOSIS — Z885 Allergy status to narcotic agent status: Secondary | ICD-10-CM | POA: Insufficient documentation

## 2017-12-22 DIAGNOSIS — R55 Syncope and collapse: Secondary | ICD-10-CM | POA: Diagnosis not present

## 2017-12-22 DIAGNOSIS — E785 Hyperlipidemia, unspecified: Secondary | ICD-10-CM | POA: Insufficient documentation

## 2017-12-22 DIAGNOSIS — K219 Gastro-esophageal reflux disease without esophagitis: Secondary | ICD-10-CM | POA: Insufficient documentation

## 2017-12-22 DIAGNOSIS — Z8546 Personal history of malignant neoplasm of prostate: Secondary | ICD-10-CM | POA: Diagnosis not present

## 2017-12-22 DIAGNOSIS — R001 Bradycardia, unspecified: Secondary | ICD-10-CM | POA: Diagnosis not present

## 2017-12-22 DIAGNOSIS — Z79899 Other long term (current) drug therapy: Secondary | ICD-10-CM | POA: Diagnosis not present

## 2017-12-22 DIAGNOSIS — I10 Essential (primary) hypertension: Secondary | ICD-10-CM | POA: Insufficient documentation

## 2017-12-22 DIAGNOSIS — Z959 Presence of cardiac and vascular implant and graft, unspecified: Secondary | ICD-10-CM

## 2017-12-22 DIAGNOSIS — I495 Sick sinus syndrome: Secondary | ICD-10-CM | POA: Insufficient documentation

## 2017-12-22 HISTORY — PX: LOOP RECORDER REMOVAL: EP1215

## 2017-12-22 HISTORY — PX: PACEMAKER IMPLANT: EP1218

## 2017-12-22 LAB — SURGICAL PCR SCREEN
MRSA, PCR: NEGATIVE
STAPHYLOCOCCUS AUREUS: NEGATIVE

## 2017-12-22 SURGERY — PACEMAKER IMPLANT

## 2017-12-22 MED ORDER — LIDOCAINE HCL 1 % IJ SOLN
INTRAMUSCULAR | Status: AC
  Filled 2017-12-22: qty 20

## 2017-12-22 MED ORDER — SODIUM CHLORIDE 0.9% FLUSH: 3.0000 mL | INTRAVENOUS | Status: DC | PRN

## 2017-12-22 MED ORDER — CHLORHEXIDINE GLUCONATE 4 % EX LIQD
60.0000 mL | Freq: Once | CUTANEOUS | Status: DC
  Filled 2017-12-22: qty 60

## 2017-12-22 MED ORDER — CEFAZOLIN SODIUM-DEXTROSE 2-4 GM/100ML-% IV SOLN
2.0000 g | INTRAVENOUS | Status: AC
  Administered 2017-12-22: 2 g via INTRAVENOUS
  Filled 2017-12-22: qty 100

## 2017-12-22 MED ORDER — SODIUM CHLORIDE 0.9 % IV SOLN
INTRAVENOUS | Status: DC
  Administered 2017-12-22: 13:00:00 via INTRAVENOUS

## 2017-12-22 MED ORDER — MUPIROCIN 2 % EX OINT
TOPICAL_OINTMENT | CUTANEOUS | Status: AC
  Administered 2017-12-22: 13:00:00
  Filled 2017-12-22: qty 22

## 2017-12-22 MED ORDER — SODIUM CHLORIDE 0.9 % IR SOLN
Status: AC
  Filled 2017-12-22: qty 2

## 2017-12-22 MED ORDER — PANTOPRAZOLE SODIUM 40 MG PO TBEC
40.0000 mg | DELAYED_RELEASE_TABLET | Freq: Every day | ORAL | Status: DC
  Administered 2017-12-23: 40 mg via ORAL
  Filled 2017-12-22: qty 1

## 2017-12-22 MED ORDER — SODIUM CHLORIDE 0.9 % IV SOLN: 250.0000 mL | INTRAVENOUS | Status: DC | PRN

## 2017-12-22 MED ORDER — MIDAZOLAM HCL 5 MG/5ML IJ SOLN
INTRAMUSCULAR | Status: DC | PRN
  Administered 2017-12-22 (×2): 1 mg via INTRAVENOUS
  Administered 2017-12-22: 2 mg via INTRAVENOUS

## 2017-12-22 MED ORDER — HEPARIN (PORCINE) IN NACL 2-0.9 UNIT/ML-% IJ SOLN
INTRAMUSCULAR | Status: AC | PRN
  Administered 2017-12-22: 500 mL

## 2017-12-22 MED ORDER — MIDAZOLAM HCL 5 MG/5ML IJ SOLN
INTRAMUSCULAR | Status: AC
  Filled 2017-12-22: qty 5

## 2017-12-22 MED ORDER — CEFAZOLIN SODIUM-DEXTROSE 1-4 GM/50ML-% IV SOLN
1.0000 g | Freq: Four times a day (QID) | INTRAVENOUS | Status: AC
  Administered 2017-12-22 – 2017-12-23 (×3): 1 g via INTRAVENOUS
  Filled 2017-12-22 (×3): qty 50

## 2017-12-22 MED ORDER — ONDANSETRON HCL 4 MG/2ML IJ SOLN: 4.0000 mg | Freq: Four times a day (QID) | INTRAMUSCULAR | Status: DC | PRN

## 2017-12-22 MED ORDER — LIDOCAINE HCL (PF) 1 % IJ SOLN
INTRAMUSCULAR | Status: DC | PRN
  Administered 2017-12-22: 80 mL

## 2017-12-22 MED ORDER — IOPAMIDOL (ISOVUE-370) INJECTION 76%
INTRAVENOUS | Status: DC | PRN
  Administered 2017-12-22: 15 mL via INTRAVENOUS

## 2017-12-22 MED ORDER — SODIUM CHLORIDE 0.9 % IR SOLN
80.0000 mg | Status: AC
  Administered 2017-12-22: 80 mg
  Filled 2017-12-22: qty 2

## 2017-12-22 MED ORDER — AMLODIPINE BESYLATE 5 MG PO TABS
5.0000 mg | ORAL_TABLET | Freq: Every day | ORAL | Status: DC
  Administered 2017-12-23: 5 mg via ORAL
  Filled 2017-12-22: qty 1

## 2017-12-22 MED ORDER — SODIUM CHLORIDE 0.9% FLUSH
3.0000 mL | Freq: Two times a day (BID) | INTRAVENOUS | Status: DC
  Administered 2017-12-22 – 2017-12-23 (×2): 3 mL via INTRAVENOUS

## 2017-12-22 MED ORDER — ACETAMINOPHEN 325 MG PO TABS
325.0000 mg | ORAL_TABLET | ORAL | Status: DC | PRN
  Administered 2017-12-23: 650 mg via ORAL
  Filled 2017-12-22: qty 2

## 2017-12-22 MED ORDER — FENTANYL CITRATE (PF) 100 MCG/2ML IJ SOLN
INTRAMUSCULAR | Status: AC
  Filled 2017-12-22: qty 2

## 2017-12-22 MED ORDER — CEFAZOLIN SODIUM-DEXTROSE 2-4 GM/100ML-% IV SOLN
INTRAVENOUS | Status: AC
  Filled 2017-12-22: qty 100

## 2017-12-22 SURGICAL SUPPLY — 8 items
CABLE SURGICAL S-101-97-12 (CABLE) ×3 IMPLANT
IPG PACE AZUR XT DR MRI W1DR01 (Pacemaker) ×1 IMPLANT
LEAD CAPSURE NOVUS 5076-52CM (Lead) ×3 IMPLANT
LEAD CAPSURE NOVUS 5076-58CM (Lead) ×3 IMPLANT
PACE AZURE XT DR MRI W1DR01 (Pacemaker) ×3 IMPLANT
PAD DEFIB LIFELINK (PAD) ×3 IMPLANT
SHEATH CLASSIC 7F (SHEATH) ×6 IMPLANT
TRAY PACEMAKER INSERTION (PACKS) ×3 IMPLANT

## 2017-12-22 NOTE — Interval H&P Note (Signed)
History and Physical Interval Note:  12/22/2017 2:36 PM  Mark Wolf  has presented today for surgery, with the diagnosis of syncope - sb  The various methods of treatment have been discussed with the patient and family. After consideration of risks, benefits and other options for treatment, the patient has consented to  Procedure(s): PACEMAKER IMPLANT (N/A) as a surgical intervention .  The patient's history has been reviewed, patient examined, no change in status, stable for surgery.  I have reviewed the patient's chart and labs.  Questions were answered to the patient's satisfaction.     After consideration, the patient is clear that he would like PPM implant with ILR removal.  He has been documented to have symptomatic bradycardia with presyncope and low heart rate.  No reversible causes have been found.   I would therefore recommend pacemaker implantation at this time.  Risks, benefits, alternatives to pacemaker implantation and ILR removal were discussed in detail with the patient today. The patient understands that the risks include but are not limited to bleeding, infection, pneumothorax, perforation, tamponade, vascular damage, renal failure, MI, stroke, death,  and lead dislodgement and wishes to proceed at this time. He is aware that he cannot have an MRI for 6 weeks post implant.   Thompson Grayer

## 2017-12-22 NOTE — Discharge Summary (Signed)
ELECTROPHYSIOLOGY PROCEDURE DISCHARGE SUMMARY    Patient ID: Mark Wolf,  MRN: [id number], DOB/AGE: February 23, 1946 72 y.o.  Admit date: 12/22/2017 Discharge date: 12/23/2017  Primary Care Physician: Deloria Lair., MD Primary Cardiologist: Bronson Ing Electrophysiologist: Que Meneely  Primary Discharge Diagnosis:  Symptomatic bradycardia status post pacemaker implantation this admission  Secondary Discharge Diagnosis:  1.  GERD 2.  HTN 3.  Prostate cancer 4.  Hernia needing repair   Allergies  Allergen Reactions  . Other     "pain medication thinks its hydrocodone - made me pass out"  . Oxycodone     Eyes rolled back in head     Procedures This Admission:  1.  Implantation of a MDT dual chamber PPM on 12/22/17 by Dr Rayann Heman.  The patient received a MDT model number Azure PPM with model number 5076 right atrial lead and 5076 right ventricular lead. ILR was also explanted at the same time. There were no immediate post procedure complications. 2.  CXR on 12/23/17 demonstrated no pneumothorax status post device implantation.   Brief HPI: Mark Wolf is a 72 y.o. male was referred to electrophysiology in the outpatient setting for consideration of PPM implantation.  Past medical history includes syncope and symptomatic bradycardia.  The patient has had symptomatic bradycardia without reversible causes identified.  Risks, benefits, and alternatives to PPM implantation were reviewed with the patient who wished to proceed.   Hospital Course:  The patient was admitted and underwent implantation of a MDT dual chamber PPM with details as outlined above.  He  was monitored on telemetry overnight which demonstrated sinus rhythm.  Left chest was without hematoma or ecchymosis.  The device was interrogated and found to be functioning normally.  CXR was obtained and demonstrated no pneumothorax status post device implantation.  Wound care, arm mobility, and restrictions were reviewed with the  patient.  The patient was examined and considered stable for discharge to home.    Physical Exam: Vitals:   12/22/17 1637 12/22/17 1924 12/22/17 2356 12/23/17 0506  BP:  (!) 150/85 (!) 149/90 (!) 144/86  Pulse: (!) 0 89 85 88  Resp: (!) 0 18 18 18   Temp:  97.8 F (36.6 C) 97.9 F (36.6 C) 98.4 F (36.9 C)  TempSrc:  Oral Oral Oral  SpO2: (!) 0% 98% 97% 95%  Weight:    190 lb 14.4 oz (86.6 kg)  Height:        GEN- The patient is well appearing, alert and oriented x 3 today.   HEENT: normocephalic, atraumatic; sclera clear, conjunctiva pink; hearing intact; oropharynx clear; neck supple  Lungs- Clear to ausculation bilaterally, normal work of breathing.  No wheezes, rales, rhonchi Heart- Regular rate and rhythm  GI- soft, non-tender, non-distended, bowel sounds present  Extremities- no clubbing, cyanosis, or edema  MS- no significant deformity or atrophy Skin- warm and dry, no rash or lesion, left chest without hematoma/ecchymosis Psych- euthymic mood, full affect Neuro- strength and sensation are intact   Labs:   Lab Results  Component Value Date   WBC 7.3 12/16/2017   HGB 17.7 12/16/2017   HCT 50.8 12/16/2017   MCV 91 12/16/2017   PLT 232 12/16/2017    Recent Labs  Lab 12/23/17 0412  NA 141  K 3.7  CL 107  CO2 25  BUN 14  CREATININE 1.14  CALCIUM 8.8*  GLUCOSE 102*    Discharge Medications:  Allergies as of 12/23/2017      Reactions  Other    "pain medication thinks its hydrocodone - made me pass out"   Oxycodone    Eyes rolled back in head      Medication List    TAKE these medications   amLODipine 5 MG tablet Commonly known as:  NORVASC Take 5 mg by mouth daily.   atorvastatin 20 MG tablet Commonly known as:  LIPITOR Take 20 mg by mouth every evening.   famotidine 20 MG tablet Commonly known as:  PEPCID Take 20 mg by mouth every evening.   NASOGEL NA Place 1 spray into the nose 2 (two) times daily. NeilMed Nasogel   omeprazole 20 MG  capsule Commonly known as:  PRILOSEC Take 20 mg by mouth daily.       Disposition:   Follow-up Information    [place] Office Follow up on 01/04/2018.   Specialty:  Cardiology Why:  at [facility] information: [address], [place]       Thompson Grayer, MD Follow up on 04/09/2018.   Specialty:  Cardiology Why:  at 9:30AM Contact information: [place] [address] [phone]           Duration of Discharge Encounter: Greater than 30 minutes including physician time.  Signed, Chanetta Marshall, NP 12/23/2017 7:43 AM  I have seen, examined the patient, and reviewed the above assessment and plan.  Changes to above are made where necessary.  Device interrogation is reviewed and normal.  CXR reveals stable leads, no ptx.  Co Sign: Thompson Grayer, MD 12/23/2017 7:43 AM

## 2017-12-23 ENCOUNTER — Ambulatory Visit (HOSPITAL_COMMUNITY): Payer: Medicare Other

## 2017-12-23 ENCOUNTER — Other Ambulatory Visit: Payer: Self-pay

## 2017-12-23 ENCOUNTER — Encounter (HOSPITAL_COMMUNITY): Payer: Self-pay | Admitting: Internal Medicine

## 2017-12-23 DIAGNOSIS — I495 Sick sinus syndrome: Secondary | ICD-10-CM

## 2017-12-23 DIAGNOSIS — R55 Syncope and collapse: Secondary | ICD-10-CM | POA: Diagnosis not present

## 2017-12-23 DIAGNOSIS — R001 Bradycardia, unspecified: Secondary | ICD-10-CM | POA: Diagnosis not present

## 2017-12-23 DIAGNOSIS — E785 Hyperlipidemia, unspecified: Secondary | ICD-10-CM | POA: Diagnosis not present

## 2017-12-23 LAB — BASIC METABOLIC PANEL
ANION GAP: 9 (ref 5–15)
BUN: 14 mg/dL (ref 6–20)
CALCIUM: 8.8 mg/dL — AB (ref 8.9–10.3)
CHLORIDE: 107 mmol/L (ref 101–111)
CO2: 25 mmol/L (ref 22–32)
Creatinine, Ser: 1.14 mg/dL (ref 0.61–1.24)
GFR calc non Af Amer: >60 mL/min (ref >60)
Glucose, Bld: 102 mg/dL — ABNORMAL HIGH (ref 65–99)
POTASSIUM: 3.7 mmol/L (ref 3.5–5.1)
Sodium: 141 mmol/L (ref 135–145)

## 2017-12-23 MED FILL — Fentanyl Citrate Preservative Free (PF) Inj 100 MCG/2ML: INTRAMUSCULAR | Qty: 2 | Status: AC

## 2017-12-23 MED FILL — Lidocaine HCl Local Inj 1%: INTRAMUSCULAR | Qty: 80 | Status: AC

## 2017-12-23 NOTE — Discharge Instructions (Signed)
° ° °  Supplemental Discharge Instructions for  Pacemaker Patients  Activity No heavy lifting or vigorous activity with your left/right arm for 6 to 8 weeks.  Do not raise your left/right arm above your head for one week.  Gradually raise your affected arm as drawn below.           __       12/26/17                       12/27/17                         12/28/17                     12/29/17  NO DRIVING for   1 week     WOUND CARE - Keep the wound area clean and dry.  Do not get this area wet for one week. No showers for one week; you may shower on   12/29/17  . - The tape/steri-strips on your wound will fall off; do not pull them off.  No bandage is needed on the site.  DO  NOT apply any creams, oils, or ointments to the wound area. - If you notice any drainage or discharge from the wound, any swelling or bruising at the site, or you develop a fever > 101? F after you are discharged home, call the office at once.  Special Instructions - You are still able to use cellular telephones; use the ear opposite the side where you have your pacemaker.  Avoid carrying your cellular phone near your device. - When traveling through airports, show security personnel your identification card to avoid being screened in the metal detectors.  Ask the security personnel to use the hand wand. - Avoid arc welding equipment, TENS units (transcutaneous nerve stimulators).  Call the office for questions about other devices. - Avoid electrical appliances that are in poor condition or are not properly grounded. - Microwave ovens are safe to be near or to operate.

## 2017-12-28 ENCOUNTER — Other Ambulatory Visit: Payer: Medicare Other

## 2017-12-31 ENCOUNTER — Other Ambulatory Visit: Payer: Self-pay | Admitting: Internal Medicine

## 2018-01-04 ENCOUNTER — Ambulatory Visit (INDEPENDENT_AMBULATORY_CARE_PROVIDER_SITE_OTHER): Payer: Medicare Other | Admitting: *Deleted

## 2018-01-04 DIAGNOSIS — R001 Bradycardia, unspecified: Secondary | ICD-10-CM | POA: Diagnosis not present

## 2018-01-04 LAB — CUP PACEART INCLINIC DEVICE CHECK
Brady Statistic AP VP Percent: 0 %
Brady Statistic AP VS Percent: 0.16 %
Brady Statistic AS VP Percent: 0.04 %
Brady Statistic AS VS Percent: 99.8 %
Brady Statistic RV Percent Paced: 0.04 %
Implantable Lead Implant Date: 20190226
Implantable Lead Implant Date: 20190226
Implantable Lead Location: 753859
Implantable Lead Model: 5076
Implantable Pulse Generator Implant Date: 20190226
Lead Channel Impedance Value: 380 Ohm
Lead Channel Impedance Value: 418 Ohm
Lead Channel Impedance Value: 703 Ohm
Lead Channel Pacing Threshold Amplitude: 0.875 V
Lead Channel Sensing Intrinsic Amplitude: 11.5 mV
Lead Channel Setting Pacing Amplitude: 3.5 V
Lead Channel Setting Pacing Amplitude: 3.5 V
Lead Channel Setting Sensing Sensitivity: 1.2 mV
MDC IDC LEAD LOCATION: 753860
MDC IDC MSMT BATTERY REMAINING LONGEVITY: 185 mo
MDC IDC MSMT BATTERY VOLTAGE: 3.22 V
MDC IDC MSMT LEADCHNL RA IMPEDANCE VALUE: 342 Ohm
MDC IDC MSMT LEADCHNL RA PACING THRESHOLD AMPLITUDE: 0.875 V
MDC IDC MSMT LEADCHNL RA PACING THRESHOLD PULSEWIDTH: 0.4 ms
MDC IDC MSMT LEADCHNL RA SENSING INTR AMPL: 3 mV
MDC IDC MSMT LEADCHNL RA SENSING INTR AMPL: 4.5 mV
MDC IDC MSMT LEADCHNL RV PACING THRESHOLD PULSEWIDTH: 0.4 ms
MDC IDC MSMT LEADCHNL RV SENSING INTR AMPL: 13.125 mV
MDC IDC SESS DTM: 20190311160233
MDC IDC SET LEADCHNL RV PACING PULSEWIDTH: 0.4 ms
MDC IDC STAT BRADY RA PERCENT PACED: 0.16 %

## 2018-01-04 NOTE — Progress Notes (Signed)
Wound check appointment. Steri-strips removed. Wound without redness or edema. Incision edges approximated, wound well healed. Normal device function. Thresholds, sensing, and impedances consistent with implant measurements. Device programmed at 3.5V programmed on for extra safety margin until 3 month visit. Histogram distribution appropriate for patient and level of activity. No mode switches or high ventricular rates noted. Patient educated about wound care, arm mobility, lifting restrictions. ROV w/ JA/E 04/09/2018

## 2018-01-06 ENCOUNTER — Telehealth: Payer: Self-pay

## 2018-01-06 NOTE — Telephone Encounter (Signed)
-----   Message from Thompson Grayer, MD sent at 01/04/2018  9:44 PM EDT ----- I have told he and his wife several times that my advise was to wait 6 weeks post implant.   ----- Message ----- From: Wanda Plump, RN Sent: 01/04/2018   3:16 PM To: Thompson Grayer, MD  Mr. Dunshee would like to know when is the earliest he could have a hernia operation? He had a new pacemaker system implanted on 12/22/17.  Thanks!

## 2018-01-06 NOTE — Telephone Encounter (Signed)
Spoke with pt informed him of Dr. Bonita Quin recommendations. Pt stated that he needed a form filled out by Dr.Allred from the 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 Informed him that he could fax it but he did not have access to a fax machine also informed him that Dr.Allred would be in [place] on 01/15/18 and he could drop the form off then and ask that Dr.Allred fill it out that day and he could pick it back up. Pt voiced understanding

## 2018-01-07 ENCOUNTER — Encounter: Payer: Self-pay | Admitting: Internal Medicine

## 2018-01-08 ENCOUNTER — Telehealth: Payer: Self-pay | Admitting: Internal Medicine

## 2018-01-08 NOTE — Telephone Encounter (Signed)
Walk In pt Form-[place] DM[place] paperwork dropped off placed in Dr.Allred doc box

## 2018-01-11 LAB — CUP PACEART REMOTE DEVICE CHECK
Date Time Interrogation Session: 20190216014050
MDC IDC PG IMPLANT DT: 20180521

## 2018-01-29 ENCOUNTER — Encounter: Payer: Self-pay | Admitting: Internal Medicine

## 2018-02-03 ENCOUNTER — Encounter: Payer: Self-pay | Admitting: Internal Medicine

## 2018-02-05 ENCOUNTER — Encounter: Payer: Self-pay | Admitting: Internal Medicine

## 2018-02-24 NOTE — Patient Instructions (Addendum)
Mark Wolf  02/24/2018     @PREFPERIOPPHARMACY @   Your procedure is scheduled on  03/03/2018   Report to [facility] at  615   A.M.  Call this number if you have problems the morning of surgery:  [phone]   Remember:  Do not eat food or drink liquids after midnight.  Take these medicines the morning of surgery with A SIP OF WATER  Amlodipine, prilosec.   Do not wear jewelry, make-up or nail polish.  Do not wear lotions, powders, or perfumes, or deodorant.  Do not shave 48 hours prior to surgery.  Men may shave face and neck.  Do not bring valuables to the hospital.  [facility] is not responsible for any belongings or valuables.  Contacts, dentures or bridgework may not be worn into surgery.  Leave your suitcase in the car.  After surgery it may be brought to your room.  For patients admitted to the hospital, discharge time will be determined by your treatment team.  Patients discharged the day of surgery will not be allowed to drive home.   Name and phone number of your driver:   family Special instructions:  None  Please read over the following fact sheets that you were given. Anesthesia Post-op Instructions and Care and Recovery After Surgery       Open Hernia Repair, Adult Open hernia repair is a surgical procedure to fix a hernia. A hernia occurs when an internal organ or tissue pushes out through a weak spot in the abdominal wall muscles. Hernias commonly occur in the groin and around the navel. Most hernias tend to get worse over time. Often, surgery is done to prevent the hernia from becoming bigger, uncomfortable, or an emergency. Emergency surgery may be needed if abdominal contents get stuck in the opening (incarcerated hernia) or the blood supply gets cut off (strangulated hernia). In an open repair, an incision is made in the abdomen to perform the surgery. Tell a health care provider about:  Any allergies you have.  All medicines you are  taking, including vitamins, herbs, eye drops, creams, and over-the-counter medicines.  Any problems you or family members have had with anesthetic medicines.  Any blood or bone disorders you have.  Any surgeries you have had.  Any medical conditions you have, including any recent cold or flu symptoms.  Whether you are pregnant or may be pregnant. What are the risks? Generally, this is a safe procedure. However, problems may occur, including:  Long-lasting (chronic) pain.  Bleeding.  Infection.  Damage to the testicle. This can cause shrinking or swelling.  Damage to the bladder, blood vessels, intestine, or nerves near the hernia.  Trouble passing urine.  Allergic reactions to medicines.  Return of the hernia.  What happens before the procedure? Staying hydrated Follow instructions from your health care provider about hydration, which may include:  Up to 2 hours before the procedure - you may continue to drink clear liquids, such as water, clear fruit juice, black coffee, and plain tea.  Eating and drinking restrictions Follow instructions from your health care provider about eating and drinking, which may include:  8 hours before the procedure - stop eating heavy meals or foods such as meat, fried foods, or fatty foods.  6 hours before the procedure - stop eating light meals or foods, such as toast or cereal.  6 hours before the procedure - stop drinking milk or drinks  that contain milk.  2 hours before the procedure - stop drinking clear liquids.  Medicines  Ask your health care provider about: ? Changing or stopping your regular medicines. This is especially important if you are taking diabetes medicines or blood thinners. ? Taking medicines such as aspirin and ibuprofen. These medicines can thin your blood. Do not take these medicines before your procedure if your health care provider instructs you not to.  You may be given antibiotic medicine to help prevent  infection. General instructions  You may have blood tests or imaging studies.  Ask your health care provider how your surgical site will be marked or identified.  If you smoke, do not smoke for at least 2 weeks before your procedure or for as long as told by your health care provider.  Let your health care provider know if you develop a cold or any infection before your surgery.  Plan to have someone take you home from the hospital or clinic.  If you will be going home right after the procedure, plan to have someone with you for 24 hours. What happens during the procedure?  To reduce your risk of infection: ? Your health care team will wash or sanitize their hands. ? Your skin will be washed with soap. ? Hair may be removed from the surgical area.  An IV tube will be inserted into one of your veins.  You will be given one or more of the following: ? A medicine to help you relax (sedative). ? A medicine to numb the area (local anesthetic). ? A medicine to make you fall asleep (general anesthetic).  Your surgeon will make an incision over the hernia.  The tissues of the hernia will be moved back into place.  The edges of the hernia may be stitched together.  The opening in the abdominal muscles will be closed with stitches (sutures). Or, your surgeon will place a mesh patch made of manmade (synthetic) material over the opening.  The incision will be closed.  A bandage (dressing) may be placed over the incision. The procedure may vary among health care providers and hospitals. What happens after the procedure?  Your blood pressure, heart rate, breathing rate, and blood oxygen level will be monitored until the medicines you were given have worn off.  You may be given medicine for pain.  Do not drive for 24 hours if you received a sedative. This information is not intended to replace advice given to you by your health care provider. Make sure you discuss any questions you  have with your health care provider. Document Released: 04/08/2001 Document Revised: 05/02/2016 Document Reviewed: 03/26/2016 Elsevier Interactive Patient Education  2018 [place], Adult, Care After These instructions give you information about caring for yourself after your procedure. Your doctor may also give you more specific instructions. If you have problems or questions, contact your doctor. Follow these instructions at home: Surgical cut (incision) care   Follow instructions from your doctor about how to take care of your surgical cut area. Make sure you: ? Wash your hands with soap and water before you change your bandage (dressing). If you cannot use soap and water, use hand sanitizer. ? Change your bandage as told by your doctor. ? Leave stitches (sutures), skin glue, or skin tape (adhesive) strips in place. They may need to stay in place for 2 weeks or longer. If tape strips get loose and curl up, you may trim the loose edges.  Do not remove tape strips completely unless your doctor says it is okay.  Check your surgical cut every day for signs of infection. Check for: ? More redness, swelling, or pain. ? More fluid or blood. ? Warmth. ? Pus or a bad smell. Activity  Do not drive or use heavy machinery while taking prescription pain medicine. Do not drive until your doctor says it is okay.  Until your doctor says it is okay: ? Do not lift anything that is heavier than 10 lb (4.5 kg). ? Do not play contact sports.  Return to your normal activities as told by your doctor. Ask your doctor what activities are safe. General instructions  To prevent or treat having a hard time pooping (constipation) while you are taking prescription pain medicine, your doctor may recommend that you: ? Drink enough fluid to keep your pee (urine) clear or pale yellow. ? Take over-the-counter or prescription medicines. ? Eat foods that are high in fiber, such as fresh  fruits and vegetables, whole grains, and beans. ? Limit foods that are high in fat and processed sugars, such as fried and sweet foods.  Take over-the-counter and prescription medicines only as told by your doctor.  Do not take baths, swim, or use a hot tub until your doctor says it is okay.  Keep all follow-up visits as told by your doctor. This is important. Contact a doctor if:  You develop a rash.  You have more redness, swelling, or pain around your surgical cut.  You have more fluid or blood coming from your surgical cut.  Your surgical cut feels warm to the touch.  You have pus or a bad smell coming from your surgical cut.  You have a fever or chills.  You have blood in your poop (stool).  You have not pooped in 2-3 days.  Medicine does not help your pain. Get help right away if:  You have chest pain or you are short of breath.  You feel light-headed.  You feel weak and dizzy (feel faint).  You have very bad pain.  You throw up (vomit) and your pain is worse. This information is not intended to replace advice given to you by your health care provider. Make sure you discuss any questions you have with your health care provider. Document Released: 11/03/2014 Document Revised: 05/02/2016 Document Reviewed: 03/26/2016 Elsevier Interactive Patient Education  2018 [place] Anesthesia, Adult General anesthesia is the use of medicines to make a person "go to sleep" (be unconscious) for a medical procedure. General anesthesia is often recommended when a procedure:  Is long.  Requires you to be still or in an unusual position.  Is major and can cause you to lose blood.  Is impossible to do without general anesthesia.  The medicines used for general anesthesia are called general anesthetics. In addition to making you sleep, the medicines:  Prevent pain.  Control your blood pressure.  Relax your muscles.  Tell a health care provider about:  Any  allergies you have.  All medicines you are taking, including vitamins, herbs, eye drops, creams, and over-the-counter medicines.  Any problems you or family members have had with anesthetic medicines.  Types of anesthetics you have had in the past.  Any bleeding disorders you have.  Any surgeries you have had.  Any medical conditions you have.  Any history of heart or lung conditions, such as heart failure, sleep apnea, or chronic obstructive pulmonary disease (COPD).  Whether you are  pregnant or may be pregnant.  Whether you use tobacco, alcohol, marijuana, or street drugs.  Any history of Armed forces logistics/support/administrative officer.  Any history of depression or anxiety. What are the risks? Generally, this is a safe procedure. However, problems may occur, including:  Allergic reaction to anesthetics.  Lung and heart problems.  Inhaling food or liquids from your stomach into your lungs (aspiration).  Injury to nerves.  Waking up during your procedure and being unable to move (rare).  Extreme agitation or a state of mental confusion (delirium) when you wake up from the anesthetic.  Air in the bloodstream, which can lead to stroke.  These problems are more likely to develop if you are having a major surgery or if you have an advanced medical condition. You can prevent some of these complications by answering all of your health care provider's questions thoroughly and by following all pre-procedure instructions. General anesthesia can cause side effects, including:  Nausea or vomiting  A sore throat from the breathing tube.  Feeling cold or shivery.  Feeling tired, washed out, or achy.  Sleepiness or drowsiness.  Confusion or agitation.  What happens before the procedure? Staying hydrated Follow instructions from your health care provider about hydration, which may include:  Up to 2 hours before the procedure - you may continue to drink clear liquids, such as water, clear fruit juice,  black coffee, and plain tea.  Eating and drinking restrictions Follow instructions from your health care provider about eating and drinking, which may include:  8 hours before the procedure - stop eating heavy meals or foods such as meat, fried foods, or fatty foods.  6 hours before the procedure - stop eating light meals or foods, such as toast or cereal.  6 hours before the procedure - stop drinking milk or drinks that contain milk.  2 hours before the procedure - stop drinking clear liquids.  Medicines  Ask your health care provider about: ? Changing or stopping your regular medicines. This is especially important if you are taking diabetes medicines or blood thinners. ? Taking medicines such as aspirin and ibuprofen. These medicines can thin your blood. Do not take these medicines before your procedure if your health care provider instructs you not to. ? Taking new dietary supplements or medicines. Do not take these during the week before your procedure unless your health care provider approves them.  If you are told to take a medicine or to continue taking a medicine on the day of the procedure, take the medicine with sips of water. General instructions   Ask if you will be going home the same day, the following day, or after a longer hospital stay. ? Plan to have someone take you home. ? Plan to have someone stay with you for the first 24 hours after you leave the hospital or clinic.  For 3-6 weeks before the procedure, try not to use any tobacco products, such as cigarettes, chewing tobacco, and e-cigarettes.  You may brush your teeth on the morning of the procedure, but make sure to spit out the toothpaste. What happens during the procedure?  You will be given anesthetics through a mask and through an IV tube in one of your veins.  You may receive medicine to help you relax (sedative).  As soon as you are asleep, a breathing tube may be used to help you breathe.  An  anesthesia specialist will stay with you throughout the procedure. He or she will help keep you comfortable  and safe by continuing to give you medicines and adjusting the amount of medicine that you get. He or she will also watch your blood pressure, pulse, and oxygen levels to make sure that the anesthetics do not cause any problems.  If a breathing tube was used to help you breathe, it will be removed before you wake up. The procedure may vary among health care providers and hospitals. What happens after the procedure?  You will wake up, often slowly, after the procedure is complete, usually in a recovery area.  Your blood pressure, heart rate, breathing rate, and blood oxygen level will be monitored until the medicines you were given have worn off.  You may be given medicine to help you calm down if you feel anxious or agitated.  If you will be going home the same day, your health care provider may check to make sure you can stand, drink, and urinate.  Your health care providers will treat your pain and side effects before you go home.  Do not drive for 24 hours if you received a sedative.  You may: ? Feel nauseous and vomit. ? Have a sore throat. ? Have mental slowness. ? Feel cold or shivery. ? Feel sleepy. ? Feel tired. ? Feel sore or achy, even in parts of your body where you did not have surgery. This information is not intended to replace advice given to you by your health care provider. Make sure you discuss any questions you have with your health care provider. Document Released: 01/20/2008 Document Revised: 03/25/2016 Document Reviewed: 09/27/2015 Elsevier Interactive Patient Education  2018 [place] Anesthesia, Adult, Care After These instructions provide you with information about caring for yourself after your procedure. Your health care provider may also give you more specific instructions. Your treatment has been planned according to current medical  practices, but problems sometimes occur. Call your health care provider if you have any problems or questions after your procedure. What can I expect after the procedure? After the procedure, it is common to have:  Vomiting.  A sore throat.  Mental slowness.  It is common to feel:  Nauseous.  Cold or shivery.  Sleepy.  Tired.  Sore or achy, even in parts of your body where you did not have surgery.  Follow these instructions at home: For at least 24 hours after the procedure:  Do not: ? Participate in activities where you could fall or become injured. ? Drive. ? Use heavy machinery. ? Drink alcohol. ? Take sleeping pills or medicines that cause drowsiness. ? Make important decisions or sign legal documents. ? Take care of children on your own.  Rest. Eating and drinking  If you vomit, drink water, juice, or soup when you can drink without vomiting.  Drink enough fluid to keep your urine clear or pale yellow.  Make sure you have little or no nausea before eating solid foods.  Follow the diet recommended by your health care provider. General instructions  Have a responsible adult stay with you until you are awake and alert.  Return to your normal activities as told by your health care provider. Ask your health care provider what activities are safe for you.  Take over-the-counter and prescription medicines only as told by your health care provider.  If you smoke, do not smoke without supervision.  Keep all follow-up visits as told by your health care provider. This is important. Contact a health care provider if:  You continue to have nausea or  vomiting at home, and medicines are not helpful.  You cannot drink fluids or start eating again.  You cannot urinate after 8-12 hours.  You develop a skin rash.  You have fever.  You have increasing redness at the site of your procedure. Get help right away if:  You have difficulty breathing.  You have  chest pain.  You have unexpected bleeding.  You feel that you are having a life-threatening or urgent problem. This information is not intended to replace advice given to you by your health care provider. Make sure you discuss any questions you have with your health care provider. Document Released: 01/19/2001 Document Revised: 03/17/2016 Document Reviewed: 09/27/2015 Elsevier Interactive Patient Education  [employer].

## 2018-02-26 ENCOUNTER — Encounter (HOSPITAL_COMMUNITY): Payer: Self-pay

## 2018-02-26 ENCOUNTER — Encounter (HOSPITAL_COMMUNITY)
Admission: RE | Admit: 2018-02-26 | Discharge: 2018-02-26 | Disposition: A | Discharge status: Home / Self Care | Payer: Medicare Other | Source: Ambulatory Visit | Attending: General Surgery | Admitting: General Surgery

## 2018-02-26 DIAGNOSIS — Z01812 Encounter for preprocedural laboratory examination: Secondary | ICD-10-CM | POA: Diagnosis not present

## 2018-02-26 HISTORY — DX: Unspecified osteoarthritis, unspecified site: M19.90

## 2018-02-26 HISTORY — DX: Presence of cardiac pacemaker: Z95.0

## 2018-02-26 LAB — CBC WITH DIFFERENTIAL/PLATELET
BASOS ABS: 0 10*3/uL (ref 0.0–0.1)
Basophils Relative: 0 %
Eosinophils Absolute: 0.1 10*3/uL (ref 0.0–0.7)
Eosinophils Relative: 2 %
HCT: 47.7 % (ref 39.0–52.0)
Hemoglobin: 15.8 g/dL (ref 13.0–17.0)
LYMPHS ABS: 2 10*3/uL (ref 0.7–4.0)
LYMPHS PCT: 29 %
MCH: 31 pg (ref 26.0–34.0)
MCHC: 33.1 g/dL (ref 30.0–36.0)
MCV: 93.5 fL (ref 78.0–100.0)
MONO ABS: 0.6 10*3/uL (ref 0.1–1.0)
Monocytes Relative: 9 %
NEUTROS ABS: 4.2 10*3/uL (ref 1.7–7.7)
Neutrophils Relative %: 60 %
Platelets: 202 10*3/uL (ref 150–400)
RBC: 5.1 MIL/uL (ref 4.22–5.81)
RDW: 12.9 % (ref 11.5–15.5)
WBC: 7 10*3/uL (ref 4.0–10.5)

## 2018-02-26 LAB — BASIC METABOLIC PANEL
ANION GAP: 9 (ref 5–15)
BUN: 17 mg/dL (ref 6–20)
CHLORIDE: 104 mmol/L (ref 101–111)
CO2: 28 mmol/L (ref 22–32)
Calcium: 9.4 mg/dL (ref 8.9–10.3)
Creatinine, Ser: 1.12 mg/dL (ref 0.61–1.24)
GFR calc Af Amer: >60 mL/min (ref >60)
GLUCOSE: 82 mg/dL (ref 65–99)
POTASSIUM: 4.5 mmol/L (ref 3.5–5.1)
Sodium: 141 mmol/L (ref 135–145)

## 2018-03-02 NOTE — H&P (Signed)
Mark Wolf; [id number]; Feb 11, 1946   HPI Patient is a 72 year old white male who was referred to my care by Suann Larry for evaluation and treatment of a right inguinal hernia.  The patient has had worsening right groin pain and swelling over the past few weeks.  It is made worse with straining or coughing.  His pain level is 2 out of 10.  He is also currently being worked up for sinus bradycardia and will be seen cardiology in a few days.  He does have a loop recorder present.  He denies any nausea or vomiting. Past Medical History:  Diagnosis Date  . GERD (gastroesophageal reflux disease)   . Hyperlipidemia   . Hypertension   . Prostate cancer ([place])   . Syncope    recurrent, unexplained,  implantable loop recorder placed    Past Surgical History:  Procedure Laterality Date  . INGUINAL HERNIA REPAIR Left   . KNEE ARTHROSCOPY Left   . LOOP RECORDER INSERTION N/A 03/16/2017   MDT LINQ implanted for recurrent unexplained syncope    Family History  Problem Relation Age of Onset  . Aneurysm Mother   . Heart failure Father     Current Outpatient Medications on File Prior to Visit  Medication Sig Dispense Refill  . amLODipine (NORVASC) 5 MG tablet Take 5 mg by mouth daily.    Marland Kitchen atorvastatin (LIPITOR) 20 MG tablet Take 20 mg by mouth daily.    . famotidine (PEPCID) 20 MG tablet Take 20 mg by mouth daily.     Marland Kitchen omeprazole (PRILOSEC) 20 MG capsule Take 20 mg by mouth daily.    . Saline (NASOGEL NA) Place 1 spray into the nose 2 (two) times daily.      No current facility-administered medications on file prior to visit.     Allergies  Allergen Reactions  . Other     "pain medication thinks its hydrocodone - made me pass out"  . Oxycodone     Eyes rolled back in head    Social History   Substance and Sexual Activity  Alcohol Use No    Social History   Tobacco Use  Smoking Status Never Smoker  Smokeless Tobacco Never Used    Review of Systems  Constitutional:  Negative.   HENT: Negative.   Eyes: Negative.   Respiratory: Negative.   Cardiovascular: Negative.   Gastrointestinal: Negative.   Genitourinary: Negative.   Musculoskeletal: Negative.   Skin: Negative.   Neurological: Positive for dizziness.  Endo/Heme/Allergies: Negative.   Psychiatric/Behavioral: Negative.     Objective   Vitals:   12/08/17 1314  BP: (!) 142/84  Pulse: 79  Temp: 98 F (36.7 C)    Physical Exam  Constitutional: He is oriented to person, place, and time and well-developed, well-nourished, and in no distress.  HENT:  Head: Normocephalic and atraumatic.  Cardiovascular: Normal rate, regular rhythm and normal heart sounds. Exam reveals no gallop and no friction rub.  No murmur heard. Pulmonary/Chest: Effort normal and breath sounds normal. No respiratory distress. He has no wheezes. He has no rales.  Abdominal: Soft. Bowel sounds are normal. He exhibits no distension. There is no tenderness. There is no rebound.  Easily reducible right inguinal hernia  Genitourinary:  Genitourinary Comments: Testicles within normal limits.  Neurological: He is alert and oriented to person, place, and time.  Skin: Skin is warm and dry.  Vitals reviewed.   Assessment  Right inguinal hernia, symptomatic History of sinus bradycardia Plan   Patient  will be scheduled for right inguinal herniorrhaphy with mesh once he has been cleared by cardiology.  The risks and benefits of the procedure including bleeding, infection, mesh use, and the possibility of recurrence of the hernia were fully explained to the patient, who gave informed consent. Addendum:  Patient has had a pacemaker placed and is clear for surgery by cardiology.

## 2018-03-03 ENCOUNTER — Ambulatory Visit (HOSPITAL_COMMUNITY)
Admission: RE | Admit: 2018-03-03 | Discharge: 2018-03-03 | Disposition: A | Discharge status: Home / Self Care | Payer: Medicare Other | Source: Ambulatory Visit | Attending: General Surgery | Admitting: General Surgery

## 2018-03-03 ENCOUNTER — Ambulatory Visit (HOSPITAL_COMMUNITY): Payer: Medicare Other | Admitting: Anesthesiology

## 2018-03-03 ENCOUNTER — Encounter (HOSPITAL_COMMUNITY)
Admission: RE | Disposition: A | Discharge status: Home / Self Care | Payer: Self-pay | Source: Ambulatory Visit | Attending: General Surgery

## 2018-03-03 ENCOUNTER — Encounter (HOSPITAL_COMMUNITY): Payer: Self-pay | Admitting: *Deleted

## 2018-03-03 DIAGNOSIS — Z885 Allergy status to narcotic agent status: Secondary | ICD-10-CM | POA: Diagnosis not present

## 2018-03-03 DIAGNOSIS — E785 Hyperlipidemia, unspecified: Secondary | ICD-10-CM | POA: Insufficient documentation

## 2018-03-03 DIAGNOSIS — R001 Bradycardia, unspecified: Secondary | ICD-10-CM | POA: Insufficient documentation

## 2018-03-03 DIAGNOSIS — Z8546 Personal history of malignant neoplasm of prostate: Secondary | ICD-10-CM | POA: Diagnosis not present

## 2018-03-03 DIAGNOSIS — K219 Gastro-esophageal reflux disease without esophagitis: Secondary | ICD-10-CM | POA: Insufficient documentation

## 2018-03-03 DIAGNOSIS — Z79899 Other long term (current) drug therapy: Secondary | ICD-10-CM | POA: Insufficient documentation

## 2018-03-03 DIAGNOSIS — Z950 Presence of cardiac pacemaker: Secondary | ICD-10-CM | POA: Insufficient documentation

## 2018-03-03 DIAGNOSIS — M1990 Unspecified osteoarthritis, unspecified site: Secondary | ICD-10-CM | POA: Diagnosis not present

## 2018-03-03 DIAGNOSIS — I10 Essential (primary) hypertension: Secondary | ICD-10-CM | POA: Insufficient documentation

## 2018-03-03 DIAGNOSIS — K4090 Unilateral inguinal hernia, without obstruction or gangrene, not specified as recurrent: Secondary | ICD-10-CM | POA: Insufficient documentation

## 2018-03-03 HISTORY — PX: INGUINAL HERNIA REPAIR: SHX194

## 2018-03-03 SURGERY — REPAIR, HERNIA, INGUINAL, ADULT
Anesthesia: General | Site: Groin | Laterality: Right

## 2018-03-03 MED ORDER — BUPIVACAINE LIPOSOME 1.3 % IJ SUSP
INTRAMUSCULAR | Status: DC | PRN
  Administered 2018-03-03: 20 mL

## 2018-03-03 MED ORDER — CHLORHEXIDINE GLUCONATE CLOTH 2 % EX PADS: 6.0000 | MEDICATED_PAD | Freq: Once | CUTANEOUS | Status: DC

## 2018-03-03 MED ORDER — FENTANYL CITRATE (PF) 100 MCG/2ML IJ SOLN
25.0000 ug | INTRAMUSCULAR | Status: DC | PRN
  Administered 2018-03-03: 50 ug via INTRAVENOUS
  Filled 2018-03-03: qty 2

## 2018-03-03 MED ORDER — TRAMADOL HCL 50 MG PO TABS: 50.0000 mg | ORAL_TABLET | Freq: Four times a day (QID) | ORAL | 0 refills | Status: DC | PRN

## 2018-03-03 MED ORDER — CEFAZOLIN SODIUM-DEXTROSE 2-4 GM/100ML-% IV SOLN
INTRAVENOUS | Status: AC
  Filled 2018-03-03: qty 100

## 2018-03-03 MED ORDER — BUPIVACAINE LIPOSOME 1.3 % IJ SUSP
INTRAMUSCULAR | Status: AC
  Filled 2018-03-03: qty 20

## 2018-03-03 MED ORDER — FENTANYL CITRATE (PF) 100 MCG/2ML IJ SOLN
INTRAMUSCULAR | Status: DC | PRN
  Administered 2018-03-03 (×2): 50 ug via INTRAVENOUS

## 2018-03-03 MED ORDER — ONDANSETRON HCL 4 MG/2ML IJ SOLN
INTRAMUSCULAR | Status: AC
  Filled 2018-03-03: qty 2

## 2018-03-03 MED ORDER — LACTATED RINGERS IV SOLN: INTRAVENOUS | Status: DC

## 2018-03-03 MED ORDER — SODIUM CHLORIDE 0.9 % IR SOLN
Status: DC | PRN
  Administered 2018-03-03: 1000 mL

## 2018-03-03 MED ORDER — PROPOFOL 10 MG/ML IV BOLUS
INTRAVENOUS | Status: DC | PRN
  Administered 2018-03-03: 130 mg via INTRAVENOUS

## 2018-03-03 MED ORDER — MIDAZOLAM HCL 2 MG/2ML IJ SOLN
INTRAMUSCULAR | Status: AC
  Filled 2018-03-03: qty 2

## 2018-03-03 MED ORDER — TRAMADOL HCL 50 MG PO TABS
ORAL_TABLET | ORAL | Status: AC
  Filled 2018-03-03: qty 1

## 2018-03-03 MED ORDER — KETOROLAC TROMETHAMINE 30 MG/ML IJ SOLN
30.0000 mg | Freq: Once | INTRAMUSCULAR | Status: AC
  Administered 2018-03-03: 30 mg via INTRAVENOUS

## 2018-03-03 MED ORDER — SODIUM CHLORIDE 0.9% FLUSH
INTRAVENOUS | Status: AC
  Filled 2018-03-03: qty 10

## 2018-03-03 MED ORDER — CEFAZOLIN SODIUM-DEXTROSE 2-4 GM/100ML-% IV SOLN
2.0000 g | INTRAVENOUS | Status: AC
  Administered 2018-03-03: 2 g via INTRAVENOUS

## 2018-03-03 MED ORDER — LIDOCAINE HCL 1 % IJ SOLN
INTRAMUSCULAR | Status: DC | PRN
  Administered 2018-03-03: 30 mg via INTRADERMAL

## 2018-03-03 MED ORDER — PROPOFOL 10 MG/ML IV BOLUS
INTRAVENOUS | Status: AC
  Filled 2018-03-03: qty 40

## 2018-03-03 MED ORDER — MIDAZOLAM HCL 5 MG/5ML IJ SOLN
INTRAMUSCULAR | Status: DC | PRN
  Administered 2018-03-03: 2 mg via INTRAVENOUS

## 2018-03-03 MED ORDER — KETOROLAC TROMETHAMINE 30 MG/ML IJ SOLN
INTRAMUSCULAR | Status: AC
  Filled 2018-03-03: qty 1

## 2018-03-03 MED ORDER — ONDANSETRON HCL 4 MG/2ML IJ SOLN
4.0000 mg | Freq: Once | INTRAMUSCULAR | Status: AC
  Administered 2018-03-03: 4 mg via INTRAVENOUS

## 2018-03-03 MED ORDER — CEFAZOLIN SODIUM-DEXTROSE 2-4 GM/100ML-% IV SOLN: 2.0000 g | INTRAVENOUS | Status: DC

## 2018-03-03 MED ORDER — TRAMADOL HCL 50 MG PO TABS
50.0000 mg | ORAL_TABLET | Freq: Once | ORAL | Status: AC
  Administered 2018-03-03: 50 mg via ORAL

## 2018-03-03 MED ORDER — FENTANYL CITRATE (PF) 100 MCG/2ML IJ SOLN
INTRAMUSCULAR | Status: AC
  Filled 2018-03-03: qty 2

## 2018-03-03 MED ORDER — LACTATED RINGERS IV SOLN
INTRAVENOUS | Status: DC | PRN
  Administered 2018-03-03: 07:00:00 via INTRAVENOUS

## 2018-03-03 SURGICAL SUPPLY — 39 items
CLOTH BEACON ORANGE TIMEOUT ST (SAFETY) ×2 IMPLANT
COVER LIGHT HANDLE STERIS (MISCELLANEOUS) ×4 IMPLANT
DERMABOND ADVANCED (GAUZE/BANDAGES/DRESSINGS) ×1
DERMABOND ADVANCED .7 DNX12 (GAUZE/BANDAGES/DRESSINGS) ×1 IMPLANT
DRAIN PENROSE 18X1/2 LTX STRL (DRAIN) ×2 IMPLANT
ELECT REM PT RETURN 9FT ADLT (ELECTROSURGICAL) ×2
ELECTRODE REM PT RTRN 9FT ADLT (ELECTROSURGICAL) ×1 IMPLANT
GAUZE SPONGE 4X4 12PLY STRL (GAUZE/BANDAGES/DRESSINGS) ×2 IMPLANT
GLOVE BIOGEL PI IND STRL 6.5 (GLOVE) ×1 IMPLANT
GLOVE BIOGEL PI IND STRL 7.0 (GLOVE) ×2 IMPLANT
GLOVE BIOGEL PI INDICATOR 6.5 (GLOVE) ×1
GLOVE BIOGEL PI INDICATOR 7.0 (GLOVE) ×2
GLOVE SURG SS PI 6.5 STRL IVOR (GLOVE) ×2 IMPLANT
GLOVE SURG SS PI 7.5 STRL IVOR (GLOVE) ×2 IMPLANT
GOWN STRL REUS W/ TWL XL LVL3 (GOWN DISPOSABLE) ×1 IMPLANT
GOWN STRL REUS W/TWL LRG LVL3 (GOWN DISPOSABLE) ×4 IMPLANT
GOWN STRL REUS W/TWL XL LVL3 (GOWN DISPOSABLE) ×1
INST SET MINOR GENERAL (KITS) ×2 IMPLANT
KIT TURNOVER KIT A (KITS) ×2 IMPLANT
MANIFOLD NEPTUNE II (INSTRUMENTS) ×2 IMPLANT
MESH HERNIA 1.6X1.9 PLUG LRG (Mesh General) ×1 IMPLANT
MESH HERNIA PLUG LRG (Mesh General) ×1 IMPLANT
NEEDLE HYPO 21X1.5 SAFETY (NEEDLE) ×2 IMPLANT
NS IRRIG 1000ML POUR BTL (IV SOLUTION) ×2 IMPLANT
PACK MINOR (CUSTOM PROCEDURE TRAY) ×2 IMPLANT
PAD ARMBOARD 7.5X6 YLW CONV (MISCELLANEOUS) ×2 IMPLANT
SET BASIN LINEN APH (SET/KITS/TRAYS/PACK) ×2 IMPLANT
SOL PREP PROV IODINE SCRUB 4OZ (MISCELLANEOUS) ×2 IMPLANT
SUT MNCRL AB 4-0 PS2 18 (SUTURE) ×2 IMPLANT
SUT NOVA NAB GS-22 2 2-0 T-19 (SUTURE) ×6 IMPLANT
SUT PROLENE 2 0 SH 30 (SUTURE) IMPLANT
SUT SILK 3 0 (SUTURE)
SUT SILK 3-0 18XBRD TIE 12 (SUTURE) IMPLANT
SUT VIC AB 2-0 CT1 27 (SUTURE) ×1
SUT VIC AB 2-0 CT1 TAPERPNT 27 (SUTURE) ×1 IMPLANT
SUT VIC AB 3-0 SH 27 (SUTURE) ×1
SUT VIC AB 3-0 SH 27X BRD (SUTURE) ×1 IMPLANT
SUT VICRYL AB 3 0 TIES (SUTURE) IMPLANT
SYR 20CC LL (SYRINGE) ×2 IMPLANT

## 2018-03-03 NOTE — Anesthesia Postprocedure Evaluation (Signed)
Anesthesia Post Note  Patient: Mark Wolf  Procedure(s) Performed: HERNIA REPAIR INGUINAL ADULT WITH MESH (Right Groin)  Patient location during evaluation: PACU Anesthesia Type: General Level of consciousness: awake and patient cooperative Pain management: pain level controlled Vital Signs Assessment: post-procedure vital signs reviewed and stable Respiratory status: spontaneous breathing, nonlabored ventilation and respiratory function stable Cardiovascular status: blood pressure returned to baseline Postop Assessment: no apparent nausea or vomiting Anesthetic complications: no     Last Vitals:  Vitals:   03/03/18 0653 03/03/18 0850  BP: (!) 143/87 122/71  Pulse: 67 (!) 58  Resp: (!) 21 15  Temp: 36.4 C 36.6 C  SpO2: 97% 100%    Last Pain:  Vitals:   03/03/18 0900  TempSrc:   PainSc: 8                  Newt Levingston J

## 2018-03-03 NOTE — Anesthesia Preprocedure Evaluation (Signed)
Anesthesia Evaluation  Patient identified by MRN, date of birth, ID band Patient awake    Reviewed: Allergy & Precautions, NPO status , Patient's Chart, lab work & pertinent test results  History of Anesthesia Complications Negative for: history of anesthetic complications  Airway Mallampati: II  TM Distance: >3 FB Neck ROM: Full    Dental no notable dental hx.    Pulmonary neg pulmonary ROS,    Pulmonary exam normal breath sounds clear to auscultation       Cardiovascular Exercise Tolerance: Good hypertension, Pt. on medications negative cardio ROS Normal cardiovascular exam+ dysrhythmias + pacemaker I Rhythm:Regular Rate:Normal  Played raquetball until Hernia abouy 13 weeks prior  S/p pacer placement 12 weeks prior for slow heart rate  -stopped one med - last ECG -normal   Neuro/Psych negative neurological ROS  negative psych ROS   GI/Hepatic negative GI ROS, Neg liver ROS, GERD  Medicated and Controlled,  Endo/Other  negative endocrine ROS  Renal/GU negative Renal ROS  negative genitourinary   Musculoskeletal negative musculoskeletal ROS (+) Arthritis , Osteoarthritis,    Abdominal   Peds negative pediatric ROS (+)  Hematology negative hematology ROS (+)   Anesthesia Other Findings   Reproductive/Obstetrics negative OB ROS                             Anesthesia Physical Anesthesia Plan  ASA: II  Anesthesia Plan: General   Post-op Pain Management:    Induction: Intravenous  PONV Risk Score and Plan:   Airway Management Planned:   Additional Equipment:   Intra-op Plan:   Post-operative Plan: Extubation in OR  Informed Consent: I have reviewed the patients History and Physical, chart, labs and discussed the procedure including the risks, benefits and alternatives for the proposed anesthesia with the patient or authorized representative who has indicated his/her  understanding and acceptance.   Dental advisory given  Plan Discussed with: CRNA  Anesthesia Plan Comments:         Anesthesia Quick Evaluation

## 2018-03-03 NOTE — Transfer of Care (Signed)
Immediate Anesthesia Transfer of Care Note  Patient: Mark Wolf  Procedure(s) Performed: HERNIA REPAIR INGUINAL ADULT WITH MESH (Right Groin)  Patient Location: PACU  Anesthesia Type:General  Level of Consciousness: drowsy and patient cooperative  Airway & Oxygen Therapy: Patient Spontanous Breathing and Patient connected to nasal cannula oxygen  Post-op Assessment: Report given to RN, Post -op Vital signs reviewed and stable and Patient moving all extremities  Post vital signs: Reviewed and stable  Last Vitals:  Vitals Value Taken Time  BP    Temp    Pulse    Resp    SpO2      Last Pain:  Vitals:   03/03/18 0653  TempSrc: Oral  PainSc:       Patients Stated Pain Goal: 1 (34/03/52 4818)  Complications: No apparent anesthesia complications

## 2018-03-03 NOTE — Op Note (Signed)
Patient:  Mark Wolf  DOB:  14-Oct-1946  MRN:  [id number]   Preop Diagnosis: Right inguinal hernia  Postop Diagnosis: Same  Procedure: Right inguinal herniorrhaphy with mesh  Surgeon: Aviva Signs, MD  Anes: General  Indications: Patient is a 72 year old white male who presents with a symptomatic right inguinal hernia.  The risks and benefits of the procedure including bleeding, infection, mesh use, possible recurrence of the hernia were fully explained to the patient, who gave informed consent.  Procedure note: The patient was placed in supine position.  After general anesthesia was administered, the right groin region was prepped and draped using the usual sterile technique with CVG.  Surgical site confirmation was performed.  Incision was made in the right groin region down to the external obique aponeurosis.  The aponeurosis was incised to the external ring.  A Penrose drain was placed around the spermatic cord.  The vase deferens was noted within the spermatic cord.  The ileal inguinal nerve was identified and retracted inferiorly from the operative field.  An indirect hernia sac was found.  This was freed away from the spermatic cord up to the peritoneal reflection and inverted.  A large Bard PerFix plug was then inserted.  An onlay patch was placed along the floor of the inguinal canal and secured superiorly to the conjoined tendon and inferiorly to the shelving edge of Poupart's ligament using a 2-0 Novafil interrupted suture.  The internal ring was re-created using a 2-0 Novafil interrupted suture.  The external oblique aponeurosis was reapproximated using a 2-0 Vicryl running suture.  The subcutaneous layer was reapproximated using a 3-0 Vicryl interrupted suture.  The skin was closed using a 4-0 Monocryl subcuticular suture.  Exparel was instilled into the surrounding wound.  Dermabond was then applied.  All tape and needle counts were correct at the end of the procedure.  The  patient was awakened and transferred to PACU in stable condition.  Complications: None  EBL: Minimal  Specimen: None

## 2018-03-03 NOTE — Anesthesia Procedure Notes (Signed)
Procedure Name: LMA Insertion Date/Time: 03/03/2018 7:47 AM Performed by: Charmaine Downs, CRNA Pre-anesthesia Checklist: Patient identified, Patient being monitored, Emergency Drugs available, Timeout performed and Suction available Patient Re-evaluated:Patient Re-evaluated prior to induction Oxygen Delivery Method: Circle System Utilized Preoxygenation: Pre-oxygenation with 100% oxygen Induction Type: IV induction Ventilation: Mask ventilation without difficulty LMA: LMA inserted LMA Size: 4.0 Number of attempts: 1 Placement Confirmation: positive ETCO2 and breath sounds checked- equal and bilateral Tube secured with: Tape Dental Injury: Teeth and Oropharynx as per pre-operative assessment

## 2018-03-03 NOTE — Discharge Instructions (Signed)
Open Hernia Repair, Adult, Care After °This sheet gives you information about how to care for yourself after your procedure. Your health care provider may also give you more specific instructions. If you have problems or questions, contact your health care provider. °What can I expect after the procedure? °After the procedure, it is common to have: °· Mild discomfort. °· Slight bruising. °· Minor swelling. °· Pain in the abdomen. ° °Follow these instructions at home: °Incision care ° °· Follow instructions from your health care provider about how to take care of your incision area. Make sure you: °? Wash your hands with soap and water before you change your bandage (dressing). If soap and water are not available, use hand sanitizer. °? Change your dressing as told by your health care provider. °? Leave stitches (sutures), skin glue, or adhesive strips in place. These skin closures may need to stay in place for 2 weeks or longer. If adhesive strip edges start to loosen and curl up, you may trim the loose edges. Do not remove adhesive strips completely unless your health care provider tells you to do that. °· Check your incision area every day for signs of infection. Check for: °? More redness, swelling, or pain. °? More fluid or blood. °? Warmth. °? Pus or a bad smell. °Activity °· Do not drive or use heavy machinery while taking prescription pain medicine. Do not drive until your health care provider approves. °· Until your health care provider approves: °? Do not lift anything that is heavier than 10 lb (4.5 kg). °? Do not play contact sports. °· Return to your normal activities as told by your health care provider. Ask your health care provider what activities are safe. °General instructions °· To prevent or treat constipation while you are taking prescription pain medicine, your health care provider may recommend that you: °? Drink enough fluid to keep your urine clear or pale yellow. °? Take over-the-counter or  prescription medicines. °? Eat foods that are high in fiber, such as fresh fruits and vegetables, whole grains, and beans. °? Limit foods that are high in fat and processed sugars, such as fried and sweet foods. °· Take over-the-counter and prescription medicines only as told by your health care provider. °· Do not take tub baths or go swimming until your health care provider approves. °· Keep all follow-up visits as told by your health care provider. This is important. °Contact a health care provider if: °· You develop a rash. °· You have more redness, swelling, or pain around your incision. °· You have more fluid or blood coming from your incision. °· Your incision feels warm to the touch. °· You have pus or a bad smell coming from your incision. °· You have a fever or chills. °· You have blood in your stool (feces). °· You have not had a bowel movement in 2-3 days. °· Your pain is not controlled with medicine. °Get help right away if: °· You have chest pain or shortness of breath. °· You feel light-headed or feel faint. °· You have severe pain. °· You vomit and your pain is worse. °This information is not intended to replace advice given to you by your health care provider. Make sure you discuss any questions you have with your health care provider. °Document Released: 05/02/2005 Document Revised: 05/02/2016 Document Reviewed: 03/26/2016 °Elsevier Interactive Patient Education © 2018 [employer] ° °

## 2018-03-03 NOTE — Interval H&P Note (Signed)
History and Physical Interval Note:  03/03/2018 7:08 AM  Mark Wolf  has presented today for surgery, with the diagnosis of right inguinal hernia  The various methods of treatment have been discussed with the patient and family. After consideration of risks, benefits and other options for treatment, the patient has consented to  Procedure(s): HERNIA REPAIR INGUINAL ADULT WITH MESH (Right) as a surgical intervention .  The patient's history has been reviewed, patient examined, no change in status, stable for surgery.  I have reviewed the patient's chart and labs.  Questions were answered to the patient's satisfaction.     Aviva Signs

## 2018-03-04 ENCOUNTER — Encounter (HOSPITAL_COMMUNITY): Payer: Self-pay | Admitting: General Surgery

## 2018-03-11 ENCOUNTER — Ambulatory Visit: Payer: Medicare Other | Admitting: General Surgery

## 2018-03-11 ENCOUNTER — Encounter: Payer: Self-pay | Admitting: General Surgery

## 2018-03-11 ENCOUNTER — Ambulatory Visit (INDEPENDENT_AMBULATORY_CARE_PROVIDER_SITE_OTHER): Payer: Self-pay | Admitting: General Surgery

## 2018-03-11 VITALS — BP 138/70 | HR 66 | Temp 97.8°F | Resp 16 | Wt 198.0 lb

## 2018-03-11 DIAGNOSIS — Z09 Encounter for follow-up examination after completed treatment for conditions other than malignant neoplasm: Secondary | ICD-10-CM

## 2018-03-11 NOTE — Progress Notes (Signed)
Subjective:     Mark Wolf  Status post right inguinal herniorrhaphy with mesh.  Doing well.  Has minimal incisional pain. Objective:    BP 138/70 (BP Location: Left Arm, Patient Position: Sitting, Cuff Size: Normal)   Pulse 66   Temp 97.8 F (36.6 C) (Temporal)   Resp 16   Wt 198 lb (89.8 kg)   BMI 27.62 kg/m   General:  alert, cooperative and no distress  Right inguinal incision healing well.  Healing ridge present.  No seroma present.  Minimal ecchymosis present.     Assessment:    Doing well postoperatively.    Plan:   Increase activity as able.  Follow-up here as needed.

## 2018-03-15 ENCOUNTER — Other Ambulatory Visit: Payer: Self-pay | Admitting: Internal Medicine

## 2018-03-23 ENCOUNTER — Encounter: Payer: Self-pay | Admitting: Internal Medicine

## 2018-04-09 ENCOUNTER — Encounter: Payer: Self-pay | Admitting: Internal Medicine

## 2018-04-09 ENCOUNTER — Ambulatory Visit (INDEPENDENT_AMBULATORY_CARE_PROVIDER_SITE_OTHER): Payer: Medicare Other | Admitting: Internal Medicine

## 2018-04-09 VITALS — BP 136/82 | HR 75 | Ht 71.0 in | Wt 202.8 lb

## 2018-04-09 DIAGNOSIS — R001 Bradycardia, unspecified: Secondary | ICD-10-CM | POA: Diagnosis not present

## 2018-04-09 DIAGNOSIS — I495 Sick sinus syndrome: Secondary | ICD-10-CM

## 2018-04-09 DIAGNOSIS — Z950 Presence of cardiac pacemaker: Secondary | ICD-10-CM

## 2018-04-09 NOTE — Progress Notes (Signed)
    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  Since his PPM implant, the patient reports doing very well.  Today, he denies symptoms of palpitations, chest pain, shortness of breath,  lower extremity edema, dizziness, presyncope, or syncope.  The patient is otherwise without complaint today.   Past Medical History:  Diagnosis Date  . Arthritis   . GERD (gastroesophageal reflux disease)   . Hyperlipidemia   . Hypertension   . Presence of permanent cardiac pacemaker   . Prostate cancer ([place])   . Syncope    recurrent, unexplained,  implantable loop recorder placed   Past Surgical History:  Procedure Laterality Date  . INGUINAL HERNIA REPAIR Left   . INGUINAL HERNIA REPAIR Right 03/03/2018   Procedure: HERNIA REPAIR INGUINAL ADULT WITH MESH;  Surgeon: Aviva Signs, MD;  Location: [facility];  Service: General;  Laterality: Right;  . KNEE ARTHROSCOPY Left   . LOOP RECORDER INSERTION N/A 03/16/2017   MDT LINQ implanted for recurrent unexplained syncope  . LOOP RECORDER REMOVAL  12/22/2017   ILR removed by Dr Rayann Heman  . PACEMAKER IMPLANT N/A 12/22/2017   MDT Azure XT DR MRI conditional PPM implanted by Dr Rayann Heman for syncope and bradycardia    ROS- all systems are reviewed and negative except as per HPI above  Current Outpatient Medications  Medication Sig Dispense Refill  . amLODipine (NORVASC) 5 MG tablet Take 5 mg by mouth daily.    Marland Kitchen atorvastatin (LIPITOR) 20 MG tablet Take 20 mg by mouth every evening.     . famotidine (PEPCID) 20 MG tablet Take 20 mg by mouth every evening.     Marland Kitchen omeprazole (PRILOSEC) 20 MG capsule Take 20 mg by mouth daily.    . Saline (NASOGEL NA) Place 1 spray into the nose 2 (two) times daily. NeilMed Nasogel     No current facility-administered medications for this visit.     Physical Exam: Vitals:   04/09/18 0915  BP: 136/82  Pulse: 75  SpO2: 97%    Weight: 202 lb 12.8 oz (92 kg)  Height: 5\' 11"  (1.803 m)    GEN- The patient is well appearing, alert and oriented x 3 today.   Head- normocephalic, atraumatic Eyes-  Sclera clear, conjunctiva pink Ears- hearing intact Oropharynx- clear Lungs- Clear to ausculation bilaterally, normal work of breathing Chest- pacemaker pocket is well healed Heart- Regular rate and rhythm, no murmurs, rubs or gallops, PMI not laterally displaced GI- soft, NT, ND, + BS Extremities- no clubbing, cyanosis, or edema  Pacemaker interrogation- reviewed in detail today,  See PACEART report   Assessment and Plan:  1. Symptomatic sinus bradycardia with syncope Normal pacemaker function No further syncope See Pace Art report No changes today  2. HTN Stable No change required today [facility] Return to see me in a year   Thompson Grayer MD, [facility] 04/09/2018 10:03 AM

## 2018-04-09 NOTE — Patient Instructions (Addendum)

## 2018-04-13 LAB — CUP PACEART INCLINIC DEVICE CHECK
Brady Statistic AP VP Percent: 0.01 %
Brady Statistic AP VS Percent: 0.96 %
Brady Statistic AS VP Percent: 0.03 %
Brady Statistic RV Percent Paced: 0.04 %
Implantable Lead Implant Date: 20190226
Implantable Lead Implant Date: 20190226
Implantable Lead Location: 753859
Implantable Lead Model: 5076
Lead Channel Impedance Value: 399 Ohm
Lead Channel Impedance Value: 418 Ohm
Lead Channel Pacing Threshold Pulse Width: 0.4 ms
Lead Channel Sensing Intrinsic Amplitude: 13.875 mV
Lead Channel Setting Pacing Amplitude: 1.5 V
Lead Channel Setting Pacing Amplitude: 2.5 V
Lead Channel Setting Sensing Sensitivity: 1.2 mV
MDC IDC LEAD LOCATION: 753860
MDC IDC MSMT BATTERY REMAINING LONGEVITY: 182 mo
MDC IDC MSMT BATTERY VOLTAGE: 3.19 V
MDC IDC MSMT LEADCHNL RA IMPEDANCE VALUE: 323 Ohm
MDC IDC MSMT LEADCHNL RA PACING THRESHOLD AMPLITUDE: 0.5 V
MDC IDC MSMT LEADCHNL RA SENSING INTR AMPL: 4.125 mV
MDC IDC MSMT LEADCHNL RV IMPEDANCE VALUE: 551 Ohm
MDC IDC MSMT LEADCHNL RV PACING THRESHOLD AMPLITUDE: 0.75 V
MDC IDC MSMT LEADCHNL RV PACING THRESHOLD PULSEWIDTH: 0.4 ms
MDC IDC PG IMPLANT DT: 20190226
MDC IDC SESS DTM: 20190614132414
MDC IDC SET LEADCHNL RV PACING PULSEWIDTH: 0.4 ms
MDC IDC STAT BRADY AS VS PERCENT: 99 %
MDC IDC STAT BRADY RA PERCENT PACED: 0.97 %

## 2018-05-07 ENCOUNTER — Ambulatory Visit (HOSPITAL_COMMUNITY): Admission: RE | Admit: 2018-05-07 | Payer: Medicare Other | Source: Ambulatory Visit

## 2018-05-07 ENCOUNTER — Ambulatory Visit (HOSPITAL_COMMUNITY)
Admission: RE | Admit: 2018-05-07 | Discharge: 2018-05-07 | Disposition: A | Discharge status: Home / Self Care | Payer: Medicare Other | Source: Ambulatory Visit | Attending: Urology | Admitting: Urology

## 2018-05-07 DIAGNOSIS — C61 Malignant neoplasm of prostate: Secondary | ICD-10-CM | POA: Insufficient documentation

## 2018-05-07 DIAGNOSIS — N400 Enlarged prostate without lower urinary tract symptoms: Secondary | ICD-10-CM | POA: Diagnosis not present

## 2018-05-07 LAB — POCT I-STAT CREATININE: Creatinine, Ser: 1.2 mg/dL (ref 0.61–1.24)

## 2018-05-07 MED ORDER — GADOBENATE DIMEGLUMINE 529 MG/ML IV SOLN
20.0000 mL | Freq: Once | INTRAVENOUS | Status: AC
  Administered 2018-05-07: 20 mL via INTRAVENOUS

## 2018-05-26 ENCOUNTER — Encounter: Payer: Self-pay | Admitting: Internal Medicine

## 2018-07-02 ENCOUNTER — Encounter: Payer: Medicare Other | Admitting: Internal Medicine

## 2018-07-12 ENCOUNTER — Ambulatory Visit (INDEPENDENT_AMBULATORY_CARE_PROVIDER_SITE_OTHER): Payer: Medicare Other | Admitting: *Deleted

## 2018-07-12 DIAGNOSIS — I495 Sick sinus syndrome: Secondary | ICD-10-CM | POA: Diagnosis not present

## 2018-07-12 DIAGNOSIS — R001 Bradycardia, unspecified: Secondary | ICD-10-CM

## 2018-07-12 NOTE — Progress Notes (Signed)
Remote pacemaker transmission.   

## 2018-07-28 LAB — CUP PACEART REMOTE DEVICE CHECK
Battery Voltage: 3.16 V
Brady Statistic AP VP Percent: 0 %
Brady Statistic AS VP Percent: 0.04 %
Brady Statistic AS VS Percent: 97.8 %
Brady Statistic RA Percent Paced: 2.16 %
Brady Statistic RV Percent Paced: 0.05 %
Implantable Lead Implant Date: 20190226
Implantable Lead Location: 753859
Implantable Lead Model: 5076
Implantable Pulse Generator Implant Date: 20190226
Lead Channel Impedance Value: 323 Ohm
Lead Channel Impedance Value: 380 Ohm
Lead Channel Impedance Value: 418 Ohm
Lead Channel Impedance Value: 532 Ohm
Lead Channel Pacing Threshold Amplitude: 0.625 V
Lead Channel Pacing Threshold Pulse Width: 0.4 ms
Lead Channel Sensing Intrinsic Amplitude: 13.125 mV
Lead Channel Sensing Intrinsic Amplitude: 13.125 mV
Lead Channel Setting Pacing Amplitude: 1.5 V
Lead Channel Setting Pacing Amplitude: 2.5 V
Lead Channel Setting Pacing Pulse Width: 0.4 ms
Lead Channel Setting Sensing Sensitivity: 1.2 mV
MDC IDC LEAD IMPLANT DT: 20190226
MDC IDC LEAD LOCATION: 753860
MDC IDC MSMT BATTERY REMAINING LONGEVITY: 179 mo
MDC IDC MSMT LEADCHNL RA PACING THRESHOLD AMPLITUDE: 0.625 V
MDC IDC MSMT LEADCHNL RA PACING THRESHOLD PULSEWIDTH: 0.4 ms
MDC IDC MSMT LEADCHNL RA SENSING INTR AMPL: 2.75 mV
MDC IDC MSMT LEADCHNL RA SENSING INTR AMPL: 2.75 mV
MDC IDC SESS DTM: 20190916042412
MDC IDC STAT BRADY AP VS PERCENT: 2.15 %

## 2018-09-29 ENCOUNTER — Other Ambulatory Visit: Payer: Self-pay

## 2018-09-29 ENCOUNTER — Emergency Department (HOSPITAL_COMMUNITY)
Admission: EM | Admit: 2018-09-29 | Discharge: 2018-09-29 | Disposition: A | Discharge status: Home / Self Care | Payer: Medicare Other | Attending: Emergency Medicine | Admitting: Emergency Medicine

## 2018-09-29 ENCOUNTER — Encounter (HOSPITAL_COMMUNITY): Payer: Self-pay | Admitting: Emergency Medicine

## 2018-09-29 ENCOUNTER — Emergency Department (HOSPITAL_COMMUNITY): Payer: Medicare Other

## 2018-09-29 DIAGNOSIS — Z79899 Other long term (current) drug therapy: Secondary | ICD-10-CM | POA: Diagnosis not present

## 2018-09-29 DIAGNOSIS — Z950 Presence of cardiac pacemaker: Secondary | ICD-10-CM | POA: Diagnosis not present

## 2018-09-29 DIAGNOSIS — I10 Essential (primary) hypertension: Secondary | ICD-10-CM | POA: Diagnosis not present

## 2018-09-29 DIAGNOSIS — J36 Peritonsillar abscess: Secondary | ICD-10-CM | POA: Diagnosis not present

## 2018-09-29 DIAGNOSIS — Z8546 Personal history of malignant neoplasm of prostate: Secondary | ICD-10-CM | POA: Insufficient documentation

## 2018-09-29 DIAGNOSIS — J029 Acute pharyngitis, unspecified: Secondary | ICD-10-CM | POA: Diagnosis present

## 2018-09-29 LAB — CBC WITH DIFFERENTIAL/PLATELET
Abs Immature Granulocytes: 0.12 10*3/uL — ABNORMAL HIGH (ref 0.00–0.07)
Basophils Absolute: 0 10*3/uL (ref 0.0–0.1)
Basophils Relative: 0 %
EOS ABS: 0 10*3/uL (ref 0.0–0.5)
Eosinophils Relative: 0 %
HEMATOCRIT: 52.1 % — AB (ref 39.0–52.0)
Hemoglobin: 16.5 g/dL (ref 13.0–17.0)
Immature Granulocytes: 1 %
LYMPHS ABS: 1.8 10*3/uL (ref 0.7–4.0)
Lymphocytes Relative: 12 %
MCH: 30 pg (ref 26.0–34.0)
MCHC: 31.7 g/dL (ref 30.0–36.0)
MCV: 94.7 fL (ref 80.0–100.0)
MONO ABS: 1.3 10*3/uL — AB (ref 0.1–1.0)
MONOS PCT: 9 %
NRBC: 0 % (ref 0.0–0.2)
Neutro Abs: 11.5 10*3/uL — ABNORMAL HIGH (ref 1.7–7.7)
Neutrophils Relative %: 78 %
Platelets: 293 10*3/uL (ref 150–400)
RBC: 5.5 MIL/uL (ref 4.22–5.81)
RDW: 12.4 % (ref 11.5–15.5)
WBC: 14.8 10*3/uL — ABNORMAL HIGH (ref 4.0–10.5)

## 2018-09-29 LAB — COMPREHENSIVE METABOLIC PANEL
ALK PHOS: 84 U/L (ref 38–126)
ALT: 27 U/L (ref 0–44)
ANION GAP: 13 (ref 5–15)
AST: 21 U/L (ref 15–41)
Albumin: 3.5 g/dL (ref 3.5–5.0)
BILIRUBIN TOTAL: 0.9 mg/dL (ref 0.3–1.2)
BUN: 26 mg/dL — ABNORMAL HIGH (ref 8–23)
CALCIUM: 9.1 mg/dL (ref 8.9–10.3)
CO2: 25 mmol/L (ref 22–32)
Chloride: 101 mmol/L (ref 98–111)
Creatinine, Ser: 1.18 mg/dL (ref 0.61–1.24)
GFR calc Af Amer: >60 mL/min (ref >60)
Glucose, Bld: 80 mg/dL (ref 70–99)
POTASSIUM: 3.8 mmol/L (ref 3.5–5.1)
Sodium: 139 mmol/L (ref 135–145)
TOTAL PROTEIN: 6.9 g/dL (ref 6.5–8.1)

## 2018-09-29 MED ORDER — CLINDAMYCIN HCL 300 MG PO CAPS: 300.0000 mg | ORAL_CAPSULE | Freq: Four times a day (QID) | ORAL | 0 refills | Status: DC

## 2018-09-29 MED ORDER — LIDOCAINE-EPINEPHRINE 1 %-1:100000 IJ SOLN
20.0000 mL | Freq: Once | INTRAMUSCULAR | Status: AC
  Administered 2018-09-29: 20 mL
  Filled 2018-09-29: qty 20

## 2018-09-29 MED ORDER — SODIUM CHLORIDE 0.9 % IV BOLUS
500.0000 mL | Freq: Once | INTRAVENOUS | Status: AC
  Administered 2018-09-29: 500 mL via INTRAVENOUS

## 2018-09-29 MED ORDER — IOHEXOL 300 MG/ML  SOLN
75.0000 mL | Freq: Once | INTRAMUSCULAR | Status: AC | PRN
  Administered 2018-09-29: 75 mL via INTRAVENOUS

## 2018-09-29 NOTE — Consult Note (Signed)
Reason for Consult:Evaluate right tonsillar abscess Referring Physician: Clifton Hill  KHALEN STYER is an 72 y.o. male.  HPI: Patient has history of sore throat for about a week and has been treated with amoxicillin with out any improvement and was referred to [facility] ER where a CT scan showed a large right tonsillar abscess.  Past Medical History:  Diagnosis Date  . Arthritis   . GERD (gastroesophageal reflux disease)   . Hyperlipidemia   . Hypertension   . Presence of permanent cardiac pacemaker   . Prostate cancer ([place])   . Syncope    recurrent, unexplained,  implantable loop recorder placed    Past Surgical History:  Procedure Laterality Date  . INGUINAL HERNIA REPAIR Left   . INGUINAL HERNIA REPAIR Right 03/03/2018   Procedure: HERNIA REPAIR INGUINAL ADULT WITH MESH;  Surgeon: Aviva Signs, MD;  Location: [facility];  Service: General;  Laterality: Right;  . KNEE ARTHROSCOPY Left   . LOOP RECORDER INSERTION N/A 03/16/2017   MDT LINQ implanted for recurrent unexplained syncope  . LOOP RECORDER REMOVAL  12/22/2017   ILR removed by Dr Rayann Heman  . PACEMAKER IMPLANT N/A 12/22/2017   MDT Azure XT DR MRI conditional PPM implanted by Dr Rayann Heman for syncope and bradycardia    Social History:  reports that he has never smoked. He has never used smokeless tobacco. He reports that he does not drink alcohol or use drugs.  Allergies:  Allergies  Allergen Reactions  . Other Other (See Comments)    "pain medication thinks its hydrocodone - made me pass out"  . Oxycodone Other (See Comments)    Eyes rolled back in head    Medications: I have reviewed the patient's current medications.  Results for orders placed or performed during the hospital encounter of 09/29/18 (from the past 48 hour(s))  CBC with Differential/Platelet     Status: Abnormal   Collection Time: 09/29/18 12:58 PM  Result Value Ref Range   WBC 14.8 (H) 4.0 - 10.5 K/uL   RBC 5.50 4.22 - 5.81 MIL/uL   Hemoglobin 16.5 13.0 - 17.0  g/dL   HCT 52.1 (H) 39.0 - 52.0 %   MCV 94.7 80.0 - 100.0 fL   MCH 30.0 26.0 - 34.0 pg   MCHC 31.7 30.0 - 36.0 g/dL   RDW 12.4 11.5 - 15.5 %   Platelets 293 150 - 400 K/uL   nRBC 0.0 0.0 - 0.2 %   Neutrophils Relative % 78 %   Neutro Abs 11.5 (H) 1.7 - 7.7 K/uL   Lymphocytes Relative 12 %   Lymphs Abs 1.8 0.7 - 4.0 K/uL   Monocytes Relative 9 %   Monocytes Absolute 1.3 (H) 0.1 - 1.0 K/uL   Eosinophils Relative 0 %   Eosinophils Absolute 0.0 0.0 - 0.5 K/uL   Basophils Relative 0 %   Basophils Absolute 0.0 0.0 - 0.1 K/uL   Immature Granulocytes 1 %   Abs Immature Granulocytes 0.12 (H) 0.00 - 0.07 K/uL    Comment: Performed at [facility], [address] [place], [place] [address]  Comprehensive metabolic panel     Status: Abnormal   Collection Time: 09/29/18 12:58 PM  Result Value Ref Range   Sodium 139 135 - 145 mmol/L   Potassium 3.8 3.5 - 5.1 mmol/L   Chloride 101 98 - 111 mmol/L   CO2 25 22 - 32 mmol/L   Glucose, Bld 80 70 - 99 mg/dL   BUN 26 (H)  8 - 23 mg/dL   Creatinine, Ser 1.18 0.61 - 1.24 mg/dL   Calcium 9.1 8.9 - 10.3 mg/dL   Total Protein 6.9 6.5 - 8.1 g/dL   Albumin 3.5 3.5 - 5.0 g/dL   AST 21 15 - 41 U/L   ALT 27 0 - 44 U/L   Alkaline Phosphatase 84 38 - 126 U/L   Total Bilirubin 0.9 0.3 - 1.2 mg/dL   GFR calc non Af Amer >60 >60 mL/min   GFR calc Af Amer >60 >60 mL/min   Anion gap 13 5 - 15    Comment: Performed at [facility], [place] [address], [place], [place] [address]    Ct Soft Tissue Neck W Contrast  Result Date: 09/29/2018 CLINICAL DATA:  Sore throat swollen tonsil 2 weeks. Recent antibiotics. EXAM: CT NECK WITH CONTRAST TECHNIQUE: Multidetector CT imaging of the neck was performed using the standard protocol following the bolus administration of intravenous contrast. CONTRAST:  73mL OMNIPAQUE IOHEXOL 300 MG/ML  SOLN COMPARISON:  CT cervical spine 02/12/2017 FINDINGS: Pharynx and larynx: Rim enhancing fluid collection in the right tonsil  measures 2.1 x 3.4 x 3.6 cm compatible with abscess. This appears to be fully liquified with a thin enhancing wall. There is mass-effect on the pharynx. Airway intact. No solid mass lesion identified. Normal epiglottis and larynx. Salivary glands: No inflammation, mass, or stone. Thyroid: Negative Lymph nodes: Right level 2 lymph node 8 mm. No pathologic lymph nodes in the neck. Vascular: Negative Limited intracranial: Negative Visualized orbits: Negative Mastoids and visualized paranasal sinuses: Negative Skeleton: Cervical spondylosis.  No acute bony abnormality. Upper chest: Lung apices clear bilaterally.  Left-sided pacemaker. Other: None IMPRESSION: Rim enhancing fluid collection right tonsil. Given the history this is most likely a tonsillar abscess. Given the patient's age, close follow-up is recommended to exclude underlying neoplasm. No pathologic adenopathy. Electronically Signed   By: Franchot Gallo M.D.   On: 09/29/2018 15:50    BZJ:IRCVE sided sore throat for over a week with out improvement   PE Alert well appearing white male Nasal exam clear Oral exam reveals large swollen right tonsil. Normal left tonsil Neck no significant adenopathy noted  Lungs clear Neuro intact  Procedure: The oropharynx was sprayed with topical Cetacaine.  The right tonsillar region was injected with 3 cc of Xylocaine with epinephrine.  An 18-gauge needle was then used to aspirate 8 cc of mucopurulent material which was sent for culture.  A small stab incision was made with a scalpel and enlarged with a hemostat and further purulent material was aspirated.  Patient had minimal bleeding.  Assessment/Plan: Right tonsillar abscess  Plan: I&D of abscess performed in the ER. Discharged home on Cleocin 300 mg 3 times daily for 1 week. Recommended gargling with warm water for the next 24 hours. He will follow-up as needed and will notify our office if he does not have significant improvement within the next 24  hours  Melony Overly 09/29/2018, 5:32 PM

## 2018-09-29 NOTE — ED Triage Notes (Signed)
Pt arrives to ED from home with complaints of a sore throat and swollen tonsils for two weeks. Pt placed in position of comfort with bed locked and lowered, call bell in reach.

## 2018-09-29 NOTE — ED Notes (Signed)
Dr Lucia Gaskins here to see

## 2018-09-29 NOTE — ED Provider Notes (Signed)
[facility] EMERGENCY DEPARTMENT Provider Note   CSN: [id number] Arrival date & time: 09/29/18  1200     History   Chief Complaint Chief Complaint  Patient presents with  . Sore Throat    HPI Mark Wolf is a 72 y.o. male.  The history is provided by the patient. No language interpreter was used.  Sore Throat  This is a new problem. The current episode started more than 1 week ago. The problem occurs constantly. The problem has been gradually worsening. Associated symptoms include shortness of breath. Nothing aggravates the symptoms. Nothing relieves the symptoms. He has tried nothing for the symptoms. The treatment provided no relief.   Pt complain of swelling of his throat.  Pt has been on antibiotics for a week.  Past Medical History:  Diagnosis Date  . Arthritis   . GERD (gastroesophageal reflux disease)   . Hyperlipidemia   . Hypertension   . Presence of permanent cardiac pacemaker   . Prostate cancer ([place])   . Syncope    recurrent, unexplained,  implantable loop recorder placed    Patient Active Problem List   Diagnosis Date Noted  . Non-recurrent unilateral inguinal hernia without obstruction or gangrene   . Sick sinus syndrome ([place]) 12/22/2017    Past Surgical History:  Procedure Laterality Date  . INGUINAL HERNIA REPAIR Left   . INGUINAL HERNIA REPAIR Right 03/03/2018   Procedure: HERNIA REPAIR INGUINAL ADULT WITH MESH;  Surgeon: Aviva Signs, MD;  Location: [facility];  Service: General;  Laterality: Right;  . KNEE ARTHROSCOPY Left   . LOOP RECORDER INSERTION N/A 03/16/2017   MDT LINQ implanted for recurrent unexplained syncope  . LOOP RECORDER REMOVAL  12/22/2017   ILR removed by Dr Rayann Heman  . PACEMAKER IMPLANT N/A 12/22/2017   MDT Azure XT DR MRI conditional PPM implanted by Dr Rayann Heman for syncope and bradycardia        Home Medications    Prior to Admission medications   Medication Sig Start Date End Date Taking? Authorizing  Provider  amLODipine (NORVASC) 5 MG tablet Take 5 mg by mouth daily.    [provider]  atorvastatin (LIPITOR) 20 MG tablet Take 20 mg by mouth every evening.     [provider]  famotidine (PEPCID) 20 MG tablet Take 20 mg by mouth every evening.     [provider]  omeprazole (PRILOSEC) 20 MG capsule Take 20 mg by mouth daily.    [provider]  Saline (NASOGEL NA) Place 1 spray into the nose 2 (two) times daily. NeilMed Nasogel    [provider]    Family History Family History  Problem Relation Age of Onset  . Aneurysm Mother   . Heart failure Father     Social History Social History   Tobacco Use  . Smoking status: Never Smoker  . Smokeless tobacco: Never Used  Substance Use Topics  . Alcohol use: No  . Drug use: No     Allergies   Other and Oxycodone   Review of Systems Review of Systems  Respiratory: Positive for shortness of breath.   All other systems reviewed and are negative.    Physical Exam Updated Vital Signs BP 133/73   Pulse (!) 57   Temp 97.7 F (36.5 C) (Oral)   Resp 17   Ht 5\' 11"  (1.803 m)   Wt 90.7 kg   SpO2 96%   BMI 27.89 kg/m   Physical Exam  Constitutional:  He appears well-developed and well-nourished.  HENT:  Head: Normocephalic and atraumatic.  Right Ear: Hearing normal.  Left Ear: Hearing normal.  Mouth/Throat: Mucous membranes are normal. Posterior oropharyngeal edema, posterior oropharyngeal erythema and tonsillar abscesses present. Tonsils are 3+ on the right.  Eyes: Pupils are equal, round, and reactive to light. Conjunctivae are normal.  Neck: Neck supple.  Cardiovascular: Normal rate and regular rhythm.  No murmur heard. Pulmonary/Chest: Effort normal and breath sounds normal. No respiratory distress.  Abdominal: Soft. There is no tenderness.  Musculoskeletal: He exhibits no edema.  Neurological: He is alert.  Skin: Skin is warm and dry.  Psychiatric: He has a normal  mood and affect.  Nursing note and vitals reviewed.    ED Treatments / Results  Labs (all labs ordered are listed, but only abnormal results are displayed) Labs Reviewed  CBC WITH DIFFERENTIAL/PLATELET - Abnormal; Notable for the following components:      Result Value   WBC 14.8 (*)    HCT 52.1 (*)    Neutro Abs 11.5 (*)    Monocytes Absolute 1.3 (*)    Abs Immature Granulocytes 0.12 (*)    All other components within normal limits  COMPREHENSIVE METABOLIC PANEL - Abnormal; Notable for the following components:   BUN 26 (*)    All other components within normal limits    EKG None  Radiology Ct Soft Tissue Neck W Contrast  Result Date: 09/29/2018 CLINICAL DATA:  Sore throat swollen tonsil 2 weeks. Recent antibiotics. EXAM: CT NECK WITH CONTRAST TECHNIQUE: Multidetector CT imaging of the neck was performed using the standard protocol following the bolus administration of intravenous contrast. CONTRAST:  52mL OMNIPAQUE IOHEXOL 300 MG/ML  SOLN COMPARISON:  CT cervical spine 02/12/2017 FINDINGS: Pharynx and larynx: Rim enhancing fluid collection in the right tonsil measures 2.1 x 3.4 x 3.6 cm compatible with abscess. This appears to be fully liquified with a thin enhancing wall. There is mass-effect on the pharynx. Airway intact. No solid mass lesion identified. Normal epiglottis and larynx. Salivary glands: No inflammation, mass, or stone. Thyroid: Negative Lymph nodes: Right level 2 lymph node 8 mm. No pathologic lymph nodes in the neck. Vascular: Negative Limited intracranial: Negative Visualized orbits: Negative Mastoids and visualized paranasal sinuses: Negative Skeleton: Cervical spondylosis.  No acute bony abnormality. Upper chest: Lung apices clear bilaterally.  Left-sided pacemaker. Other: None IMPRESSION: Rim enhancing fluid collection right tonsil. Given the history this is most likely a tonsillar abscess. Given the patient's age, close follow-up is recommended to exclude  underlying neoplasm. No pathologic adenopathy. Electronically Signed   By: Franchot Gallo M.D.   On: 09/29/2018 15:50    Procedures Procedures (including critical care time)  Medications Ordered in ED Medications  lidocaine-EPINEPHrine (XYLOCAINE W/EPI) 1 %-1:100000 (with pres) injection 20 mL (has no administration in time range)  sodium chloride 0.9 % bolus 500 mL (0 mLs Intravenous Stopped 09/29/18 1524)  iohexol (OMNIPAQUE) 300 MG/ML solution 75 mL (75 mLs Intravenous Contrast Given 09/29/18 1542)     Initial Impression / Assessment and Plan / ED Course  I have reviewed the triage vital signs and the nursing notes.  Pertinent labs & imaging results that were available during my care of the patient were reviewed by me and considered in my medical decision making (see chart for details).     Ct scan shows 3cm abscess right tonsil.   I spoke to Dr. Lucia Gaskins ENT.  He will see here and evaluate.   Final Clinical Impressions(s) /  ED Diagnoses   Final diagnoses:  Peritonsillar abscess    ED Discharge Orders    None       Sidney Ace 09/29/18 1627    Jola Schmidt, MD 10/01/18 [phone]

## 2018-09-29 NOTE — ED Notes (Signed)
To ct

## 2018-09-29 NOTE — ED Notes (Signed)
Patient transported to CT 

## 2018-10-03 LAB — BODY FLUID CULTURE: Special Requests: NORMAL

## 2018-10-04 ENCOUNTER — Telehealth: Payer: Self-pay | Admitting: *Deleted

## 2018-10-04 NOTE — Progress Notes (Signed)
ED Antimicrobial Stewardship Positive Culture Follow Up   Mark Wolf is an 72 y.o. male who presented to [facility] on 09/29/2018 with a chief complaint of  Chief Complaint  Patient presents with  . Sore Throat    Recent Results (from the past 720 hour(s))  Body fluid culture     Status: None   Collection Time: 09/29/18  6:00 PM  Result Value Ref Range Status   Specimen Description ABSCESS  Final   Special Requests   Final    Normal Performed at [facility], [address], [place], [place] [address]    Gram Stain   Final    ABUNDANT WBC PRESENT, PREDOMINANTLY PMN ABUNDANT GRAM POSITIVE COCCI ABUNDANT GRAM POSITIVE RODS MODERATE GRAM NEGATIVE RODS    Culture   Final    MODERATE EIKENELLA CORRODENS Usually susceptible to penicillin and other beta lactam agents,quinolones,macrolides and tetracyclines. MODERATE VIRIDANS STREPTOCOCCUS MIXED ANAEROBIC FLORA PRESENT.  CALL LAB IF FURTHER IID REQUIRED.    Report Status 10/03/2018 FINAL  Final    [x]  Treated with clindamycin, organism resistant to prescribed antimicrobial []  Patient discharged originally without antimicrobial agent and treatment is now indicated  New antibiotic prescription: DC clindamycin, start augmentin 875mg  PO BID x 7 days  ED Provider: Lenn Sink, PA   Debbi Strandberg, Rande Lawman 10/04/2018, 8:44 AM Clinical Pharmacist Monday - Friday phone -  [phone] Saturday - Sunday phone - [phone]

## 2018-10-04 NOTE — Telephone Encounter (Signed)
Post ED Visit - Positive Culture Follow-up: Successful Patient Follow-Up  Culture assessed and recommendations reviewed by:  []  Elenor Quinones, Pharm.D. []  Heide Guile, Pharm.D., BCPS AQ-ID []  Parks Neptune, Pharm.D., BCPS []  Alycia Rossetti, Pharm.D., BCPS []  Raub, [place].D., BCPS, AAHIVP []  Legrand Como, Pharm.D., BCPS, AAHIVP [x]  Salome Arnt, PharmD, BCPS []  Johnnette Gourd, PharmD, BCPS []  Hughes Better, PharmD, BCPS []  Leeroy Cha, PharmD  Positive body fluid culture  []  Patient discharged without antimicrobial prescription and treatment is now indicated [x]  Organism is resistant to prescribed ED discharge antimicrobial []  Patient with positive blood cultures  Changes discussed with ED provider, Lenn Sink, PA-C New antibiotic prescription Augmentin 875mg  PO BID x 7 days, Stop Clindamycin Called to [place] patient, date 10/04/2018, time 0915   Ardeen Fillers 10/04/2018, 9:15 AM

## 2018-10-11 ENCOUNTER — Ambulatory Visit (INDEPENDENT_AMBULATORY_CARE_PROVIDER_SITE_OTHER): Payer: Medicare Other

## 2018-10-11 DIAGNOSIS — I495 Sick sinus syndrome: Secondary | ICD-10-CM | POA: Diagnosis not present

## 2018-10-11 NOTE — Progress Notes (Signed)
Remote pacemaker transmission.   

## 2018-11-21 LAB — CUP PACEART REMOTE DEVICE CHECK
Battery Remaining Longevity: 176 mo
Battery Voltage: 3.13 V
Brady Statistic RA Percent Paced: 2.47 %
Brady Statistic RV Percent Paced: 0.05 %
Date Time Interrogation Session: 20191216042029
Implantable Lead Implant Date: 20190226
Implantable Lead Location: 753859
Implantable Lead Location: 753860
Implantable Lead Model: 5076
Implantable Lead Model: 5076
Implantable Pulse Generator Implant Date: 20190226
Lead Channel Impedance Value: 323 Ohm
Lead Channel Impedance Value: 513 Ohm
Lead Channel Pacing Threshold Amplitude: 0.625 V
Lead Channel Pacing Threshold Pulse Width: 0.4 ms
Lead Channel Sensing Intrinsic Amplitude: 2.75 mV
Lead Channel Sensing Intrinsic Amplitude: 2.75 mV
Lead Channel Setting Pacing Amplitude: 2.5 V
Lead Channel Setting Sensing Sensitivity: 1.2 mV
MDC IDC LEAD IMPLANT DT: 20190226
MDC IDC MSMT LEADCHNL RA IMPEDANCE VALUE: 380 Ohm
MDC IDC MSMT LEADCHNL RA PACING THRESHOLD AMPLITUDE: 0.625 V
MDC IDC MSMT LEADCHNL RV IMPEDANCE VALUE: 418 Ohm
MDC IDC MSMT LEADCHNL RV PACING THRESHOLD PULSEWIDTH: 0.4 ms
MDC IDC MSMT LEADCHNL RV SENSING INTR AMPL: 14.875 mV
MDC IDC MSMT LEADCHNL RV SENSING INTR AMPL: 14.875 mV
MDC IDC SET LEADCHNL RA PACING AMPLITUDE: 1.5 V
MDC IDC SET LEADCHNL RV PACING PULSEWIDTH: 0.4 ms
MDC IDC STAT BRADY AP VP PERCENT: 0.01 %
MDC IDC STAT BRADY AP VS PERCENT: 2.47 %
MDC IDC STAT BRADY AS VP PERCENT: 0.04 %
MDC IDC STAT BRADY AS VS PERCENT: 97.48 %

## 2018-12-21 ENCOUNTER — Ambulatory Visit (INDEPENDENT_AMBULATORY_CARE_PROVIDER_SITE_OTHER): Payer: Medicare Other | Admitting: Urology

## 2018-12-21 DIAGNOSIS — C61 Malignant neoplasm of prostate: Secondary | ICD-10-CM | POA: Diagnosis not present

## 2018-12-21 DIAGNOSIS — N400 Enlarged prostate without lower urinary tract symptoms: Secondary | ICD-10-CM | POA: Diagnosis not present

## 2019-01-05 ENCOUNTER — Other Ambulatory Visit: Payer: Self-pay | Admitting: Family

## 2019-01-07 ENCOUNTER — Other Ambulatory Visit (HOSPITAL_COMMUNITY): Payer: Self-pay | Admitting: Family

## 2019-01-07 DIAGNOSIS — M25561 Pain in right knee: Secondary | ICD-10-CM

## 2019-01-10 ENCOUNTER — Ambulatory Visit (INDEPENDENT_AMBULATORY_CARE_PROVIDER_SITE_OTHER): Payer: Medicare Other | Admitting: *Deleted

## 2019-01-10 DIAGNOSIS — I495 Sick sinus syndrome: Secondary | ICD-10-CM

## 2019-01-10 DIAGNOSIS — R001 Bradycardia, unspecified: Secondary | ICD-10-CM

## 2019-01-11 LAB — CUP PACEART REMOTE DEVICE CHECK
Battery Remaining Longevity: 173 mo
Battery Voltage: 3.09 V
Brady Statistic AP VP Percent: 0 %
Brady Statistic RA Percent Paced: 0.49 %
Brady Statistic RV Percent Paced: 0.04 %
Date Time Interrogation Session: 20200316042122
Implantable Lead Implant Date: 20190226
Implantable Lead Location: 753859
Implantable Lead Model: 5076
Implantable Lead Model: 5076
Lead Channel Impedance Value: 304 Ohm
Lead Channel Impedance Value: 380 Ohm
Lead Channel Impedance Value: 418 Ohm
Lead Channel Impedance Value: 475 Ohm
Lead Channel Pacing Threshold Amplitude: 0.625 V
Lead Channel Pacing Threshold Pulse Width: 0.4 ms
Lead Channel Pacing Threshold Pulse Width: 0.4 ms
Lead Channel Sensing Intrinsic Amplitude: 2.625 mV
Lead Channel Setting Pacing Amplitude: 2.5 V
Lead Channel Setting Pacing Pulse Width: 0.4 ms
MDC IDC LEAD IMPLANT DT: 20190226
MDC IDC LEAD LOCATION: 753860
MDC IDC MSMT LEADCHNL RA SENSING INTR AMPL: 2.625 mV
MDC IDC MSMT LEADCHNL RV PACING THRESHOLD AMPLITUDE: 0.5 V
MDC IDC MSMT LEADCHNL RV SENSING INTR AMPL: 13.375 mV
MDC IDC MSMT LEADCHNL RV SENSING INTR AMPL: 13.375 mV
MDC IDC PG IMPLANT DT: 20190226
MDC IDC SET LEADCHNL RA PACING AMPLITUDE: 1.5 V
MDC IDC SET LEADCHNL RV SENSING SENSITIVITY: 1.2 mV
MDC IDC STAT BRADY AP VS PERCENT: 0.49 %
MDC IDC STAT BRADY AS VP PERCENT: 0.04 %
MDC IDC STAT BRADY AS VS PERCENT: 99.47 %

## 2019-01-18 NOTE — Progress Notes (Signed)
Remote pacemaker transmission.   

## 2019-01-31 ENCOUNTER — Telehealth: Payer: Self-pay

## 2019-01-31 NOTE — Telephone Encounter (Signed)
Pt received paperwork from the [place] stated that needs to be filled out by Dr. Rayann Heman. If Dr. Rayann Heman or his nurse could give him a call soon. He will greatly appreciate it. His phone number is [phone].

## 2019-03-15 ENCOUNTER — Other Ambulatory Visit: Payer: Self-pay

## 2019-03-15 ENCOUNTER — Ambulatory Visit (HOSPITAL_COMMUNITY)
Admission: RE | Admit: 2019-03-15 | Discharge: 2019-03-15 | Disposition: A | Discharge status: Home / Self Care | Payer: Medicare Other | Source: Ambulatory Visit | Attending: Family | Admitting: Family

## 2019-03-15 DIAGNOSIS — M25561 Pain in right knee: Secondary | ICD-10-CM | POA: Diagnosis present

## 2019-03-15 IMAGING — MR MRI OF THE RIGHT KNEE WITHOUT CONTRAST
7 series · 40 of 40 positions shown · non-contrast
Comparison: None.

CLINICAL DATA: Medial knee pain for 3 months. No specific injury or
prior relevant surgery.

EXAM:
MRI OF THE RIGHT KNEE WITHOUT CONTRAST
TECHNIQUE: Multiplanar, multisequence MR imaging of the knee was performed. No
intravenous contrast was administered.

[Series 15: T2 fat-sat · axial · right · 4.0mm · 0.36mm/px · z∈[-69,+40]mm · 5 of 23 slices shown (1 of 3)]
[im 1/23]
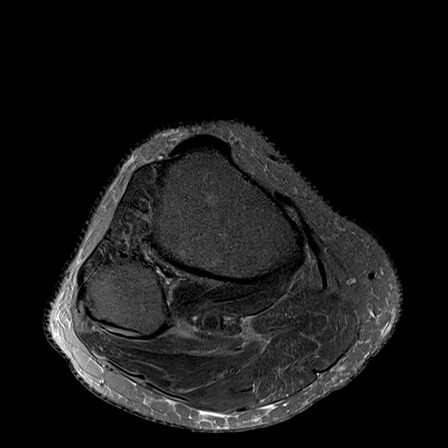
[im 6/23]
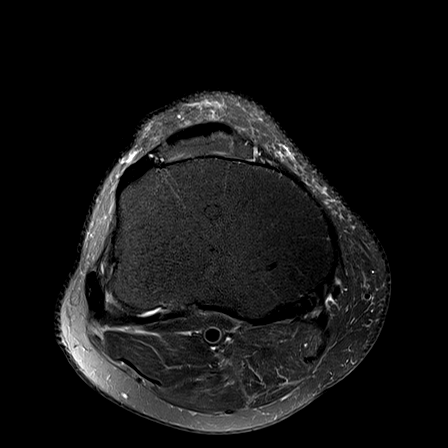
[im 12/23]
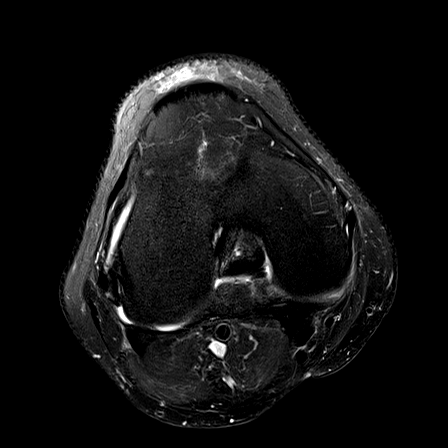
[im 17/23]
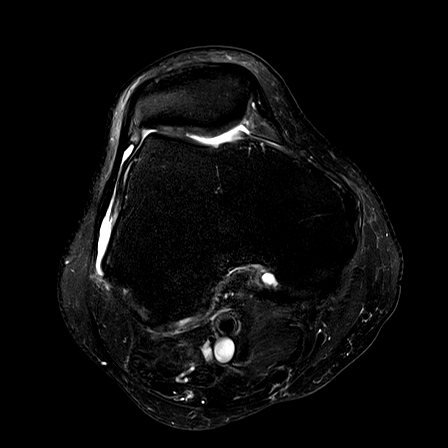
[im 23/23]
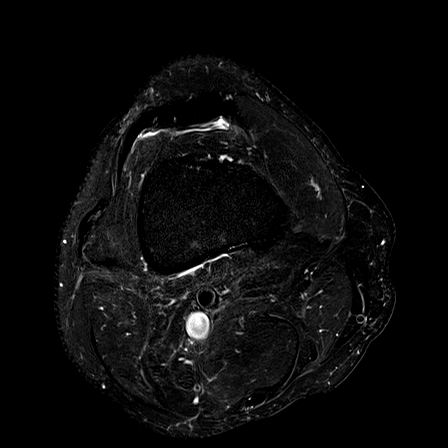

[Series 16: T1 · coronal · right · 4.0mm · 0.46mm/px · 6 of 22 slices shown]
[im 1/22]
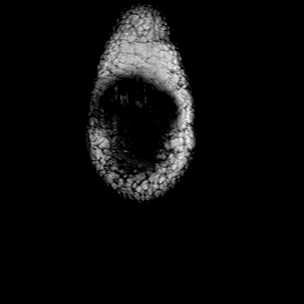
[im 5/22]
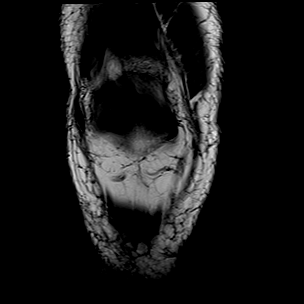
[im 9/22]
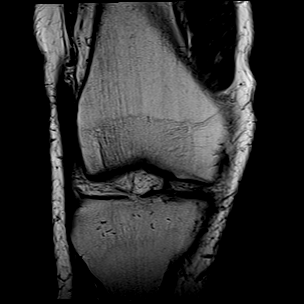
[im 13/22]
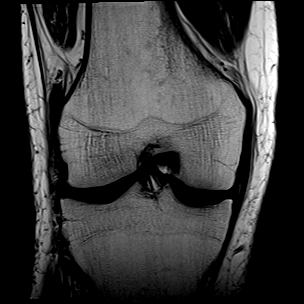
[im 17/22]
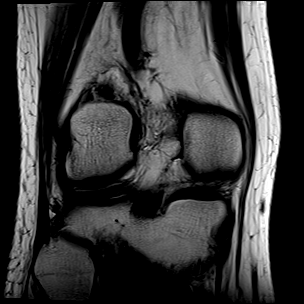
[im 22/22]
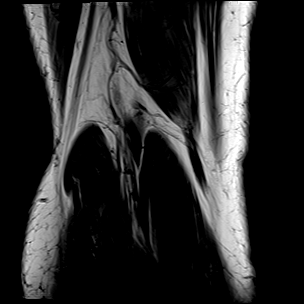

[Series 17: T2 fat-sat · coronal · right · 4.0mm · 0.59mm/px · 6 of 22 slices shown (2 of 3)]
[im 1/22]
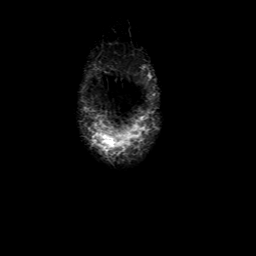
[im 5/22]
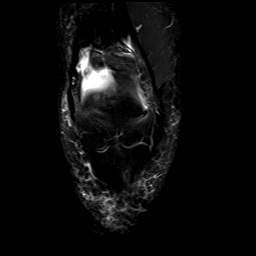
[im 9/22]
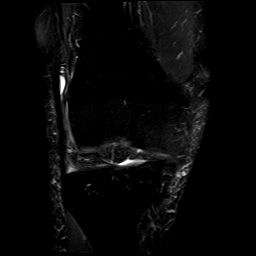
[im 13/22]
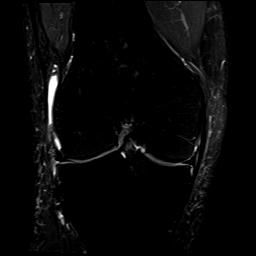
[im 17/22]
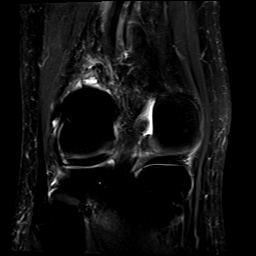
[im 22/22]
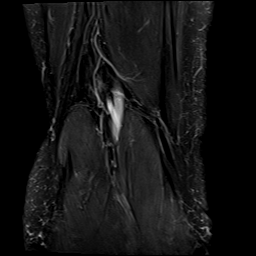

[Series 18: PD fat-sat · coronal · right · 4.0mm · 0.44mm/px · 6 of 22 slices shown (1 of 2)]
[im 1/22]
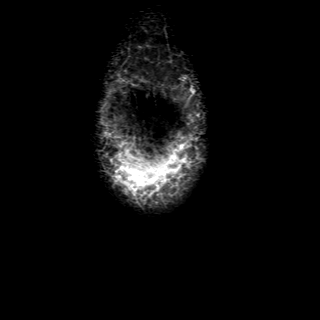
[im 5/22]
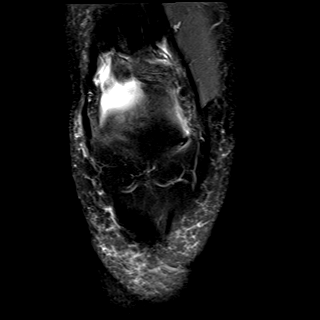
[im 9/22]
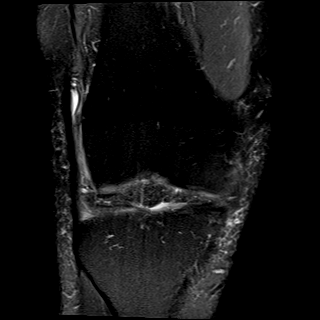
[im 13/22]
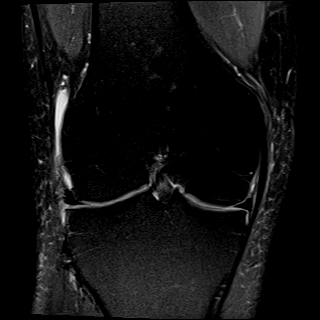
[im 17/22]
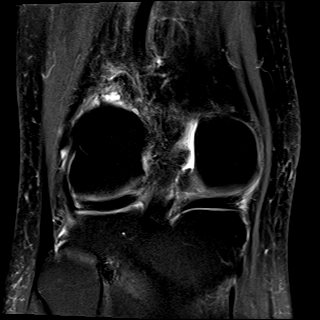
[im 22/22]
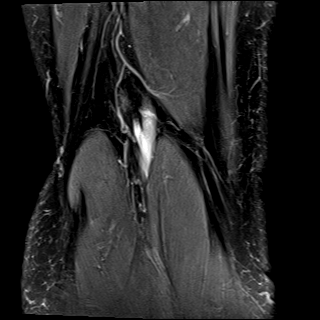

[Series 19: PD fat-sat · sagittal · right · 3.0mm · 0.52mm/px · 7 of 26 slices shown (2 of 2)]
[im 1/26]
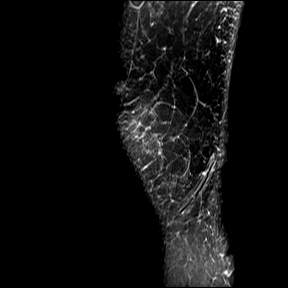
[im 5/26]
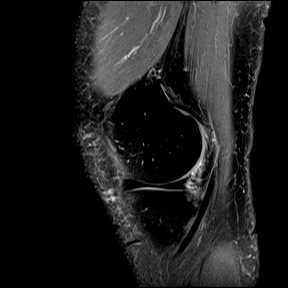
[im 9/26]
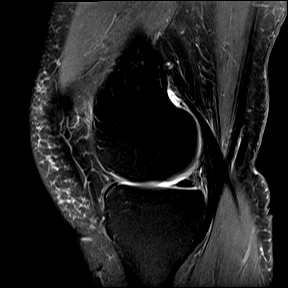
[im 13/26]
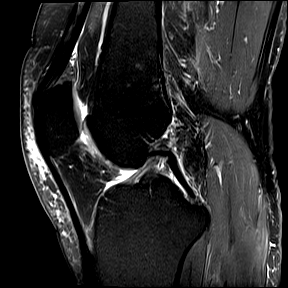
[im 17/26]
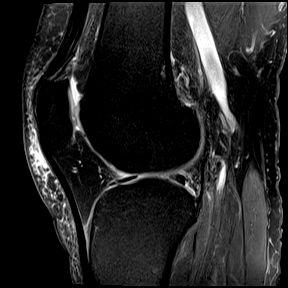
[im 21/26]
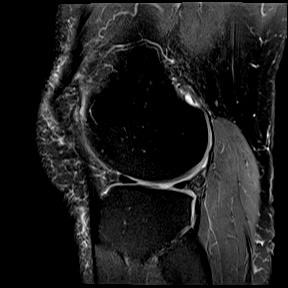
[im 26/26]
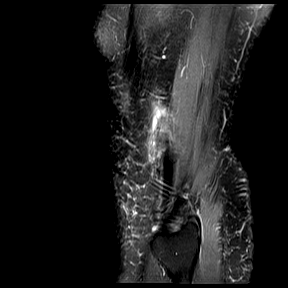

[Series 20: T2 fat-sat · sagittal · right · 3.0mm · 0.52mm/px · 7 of 26 slices shown (3 of 3)]
[im 1/26]
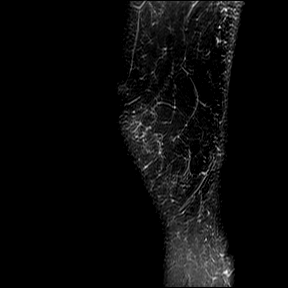
[im 5/26]
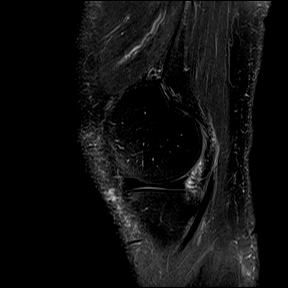
[im 9/26]
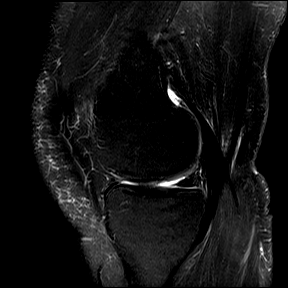
[im 13/26]
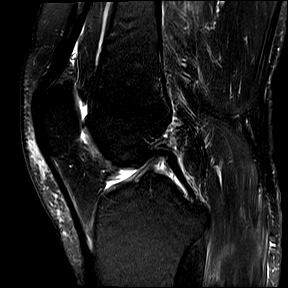
[im 17/26]
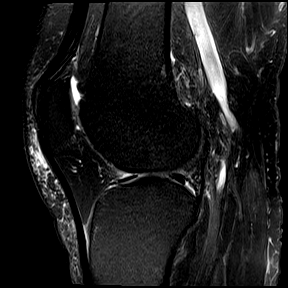
[im 21/26]
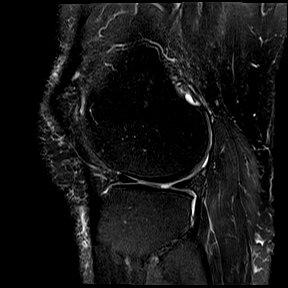
[im 26/26]
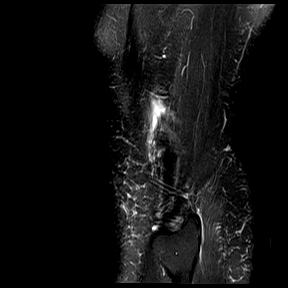

[Series 21: PD · oblique · right · 2.0mm · 0.47mm/px · 3 of 10 slices shown]
[im 1/10]
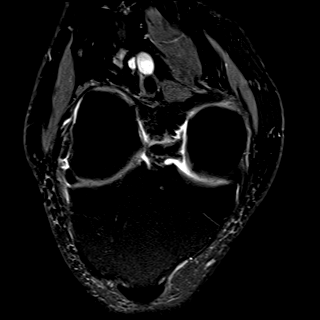
[im 5/10]
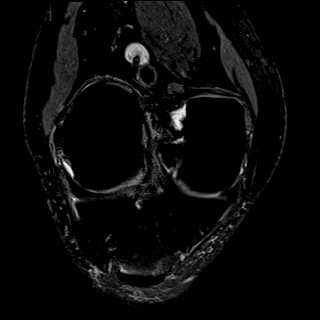
[im 10/10]
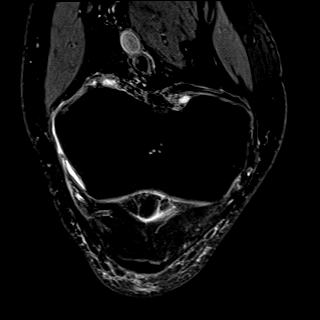

[40 of 40 positions shown; findings below may reference images not displayed]

FINDINGS: MENISCI

Medial meniscus:  Intact with normal morphology.

Lateral meniscus: There is a small radial tear involving the
posterior horn which is best seen on coronal images 7-9 of series
18. There is no displaced meniscal fragment.

LIGAMENTS

Cruciates:  Intact.

Collaterals:  Intact.

CARTILAGE

Patellofemoral: Mild chondral thinning and surface irregularity
along the lateral patellar facet. No full-thickness chondral defect.
There is subchondral cyst formation in the lateral aspect of the
trochlea.

Medial:  Mild chondral thinning and surface irregularity.

Lateral: Small nearly full-thickness chondral defect involving the
central aspect of the lateral tibial plateau. No subchondral signal
abnormality.

MISCELLANEOUS

Joint:  No significant joint effusion.

Popliteal Fossa:  Unremarkable. No significant Baker's cyst.

Extensor Mechanism: Intact. Mild spurring at the quadriceps
insertion on the patella.

Bones:  No acute or significant extra-articular osseous findings.

Other: Mild prepatellar subcutaneous edema without focal fluid
collection. Hoffa's fat demonstrates no significant findings.
IMPRESSION: 1. Small radial tear involving the posterior horn of the lateral
meniscus. No displaced meniscal fragment.
2. Intact medial meniscus, cruciate and collateral ligaments.
3. Mild tricompartmental degenerative changes. No acute osseous
findings.

## 2019-04-01 ENCOUNTER — Encounter: Payer: Medicare Other | Admitting: Internal Medicine

## 2019-04-01 ENCOUNTER — Telehealth: Payer: Self-pay

## 2019-04-08 ENCOUNTER — Telehealth (INDEPENDENT_AMBULATORY_CARE_PROVIDER_SITE_OTHER): Payer: Medicare Other | Admitting: Internal Medicine

## 2019-04-08 VITALS — BP 136/80 | HR 78 | Wt 198.0 lb

## 2019-04-08 DIAGNOSIS — I495 Sick sinus syndrome: Secondary | ICD-10-CM | POA: Diagnosis not present

## 2019-04-08 DIAGNOSIS — I10 Essential (primary) hypertension: Secondary | ICD-10-CM

## 2019-04-08 NOTE — Progress Notes (Signed)
Electrophysiology TeleHealth Note   Due to national recommendations of social distancing due to COVID 19, an audio/video telehealth visit is felt to be most appropriate for this patient at this time.  See MyChart message from today for the patient's consent to telehealth for [facility].   Date:  04/08/2019   ID:  Mark Wolf, DOB 01/15/1946, MRN [id number]  Location: patient's home  Provider location:  [facility]  Evaluation Performed: Follow-up visit  PCP:  Deloria Lair., MD   Electrophysiologist:  Dr Rayann Heman  Chief Complaint:  Pacemaker follow up  History of Present Illness:    Mark Wolf is a 73 y.o. male who presents via telehealth conferencing today.  Since last being seen in our clinic, the patient reports doing very well. Since last being seen, he has had hernia surgery.  Today, he denies symptoms of palpitations, chest pain, shortness of breath,  lower extremity edema, dizziness, presyncope, or syncope.  The patient is otherwise without complaint today.  The patient denies symptoms of fevers, chills, cough, or new SOB worrisome for COVID 19.  Past Medical History:  Diagnosis Date  . Arthritis   . GERD (gastroesophageal reflux disease)   . Hyperlipidemia   . Hypertension   . Presence of permanent cardiac pacemaker   . Prostate cancer ([place])   . Syncope    recurrent, unexplained,  implantable loop recorder placed    Past Surgical History:  Procedure Laterality Date  . INGUINAL HERNIA REPAIR Left   . INGUINAL HERNIA REPAIR Right 03/03/2018   Procedure: HERNIA REPAIR INGUINAL ADULT WITH MESH;  Surgeon: Aviva Signs, MD;  Location: [facility];  Service: General;  Laterality: Right;  . KNEE ARTHROSCOPY Left   . LOOP RECORDER INSERTION N/A 03/16/2017   MDT LINQ implanted for recurrent unexplained syncope  . LOOP RECORDER REMOVAL  12/22/2017   ILR removed by Dr Rayann Heman  . PACEMAKER IMPLANT N/A 12/22/2017   MDT Azure XT DR MRI conditional PPM implanted by Dr  Rayann Heman for syncope and bradycardia    Current Outpatient Medications  Medication Sig Dispense Refill  . amLODipine (NORVASC) 5 MG tablet Take 5 mg by mouth daily.    Marland Kitchen atorvastatin (LIPITOR) 20 MG tablet Take 20 mg by mouth every evening.     . clindamycin (CLEOCIN) 300 MG capsule Take 1 capsule (300 mg total) by mouth every 6 (six) hours. 28 capsule 0  . famotidine (PEPCID) 20 MG tablet Take 20 mg by mouth every evening.     Marland Kitchen omeprazole (PRILOSEC) 20 MG capsule Take 20 mg by mouth daily.    . Saline (NASOGEL NA) Place 1 spray into the nose 2 (two) times daily. NeilMed Nasogel     No current facility-administered medications for this visit.     Allergies:   Other and Oxycodone   Social History:  The patient  reports that he has never smoked. He has never used smokeless tobacco. He reports that he does not drink alcohol or use drugs.   Family History:  The patient's  family history includes Aneurysm in his mother; Heart failure in his father.   ROS:  Please see the history of present illness.   All other systems are personally reviewed and negative.    Exam:    Vital Signs:  There were no vitals taken for this visit.  Well sounding today   Labs/Other Tests and Data Reviewed:    Recent Labs: 09/29/2018: ALT 27; BUN 26; Creatinine, Ser 1.18;  Hemoglobin 16.5; Platelets 293; Potassium 3.8; Sodium 139   Wt Readings from Last 3 Encounters:  09/29/18 200 lb (90.7 kg)  04/09/18 202 lb 12.8 oz (92 kg)  03/11/18 198 lb (89.8 kg)     Last device remote is reviewed from [place] PDF which reveals normal device function, no arrhythmias    ASSESSMENT & PLAN:    1.  Symptomatic sinus bradycarida Normal pacemaker function See PaceArt for recent results No changes today  2.  HTN Stable No change required today   Follow-up:  [facility], me in 1 year    Patient Risk:  after full review of this patients clinical status, I feel that they are at moderate risk at this time.  Today,  I have spent 15 minutes with the patient with telehealth technology discussing arrhythmia management .    Army Fossa, MD  04/08/2019 9:47 AM     [address] [place] [address] [phone] (office) [phone] (fax)

## 2019-04-11 ENCOUNTER — Ambulatory Visit (INDEPENDENT_AMBULATORY_CARE_PROVIDER_SITE_OTHER): Payer: Medicare Other | Admitting: *Deleted

## 2019-04-11 DIAGNOSIS — I495 Sick sinus syndrome: Secondary | ICD-10-CM | POA: Diagnosis not present

## 2019-04-11 LAB — CUP PACEART REMOTE DEVICE CHECK
Battery Remaining Longevity: 170 mo
Battery Voltage: 3.06 V
Brady Statistic AP VP Percent: 0.01 %
Brady Statistic AP VS Percent: 7.74 %
Brady Statistic AS VP Percent: 0.03 %
Brady Statistic AS VS Percent: 92.22 %
Brady Statistic RA Percent Paced: 7.8 %
Brady Statistic RV Percent Paced: 0.04 %
Date Time Interrogation Session: 20200615054027
Implantable Lead Implant Date: 20190226
Implantable Lead Implant Date: 20190226
Implantable Lead Location: 753859
Implantable Lead Location: 753860
Implantable Lead Model: 5076
Implantable Lead Model: 5076
Implantable Pulse Generator Implant Date: 20190226
Lead Channel Impedance Value: 323 Ohm
Lead Channel Impedance Value: 399 Ohm
Lead Channel Impedance Value: 437 Ohm
Lead Channel Impedance Value: 513 Ohm
Lead Channel Pacing Threshold Amplitude: 0.5 V
Lead Channel Pacing Threshold Amplitude: 0.5 V
Lead Channel Pacing Threshold Pulse Width: 0.4 ms
Lead Channel Pacing Threshold Pulse Width: 0.4 ms
Lead Channel Sensing Intrinsic Amplitude: 12.75 mV
Lead Channel Sensing Intrinsic Amplitude: 12.75 mV
Lead Channel Sensing Intrinsic Amplitude: 2.875 mV
Lead Channel Sensing Intrinsic Amplitude: 2.875 mV
Lead Channel Setting Pacing Amplitude: 1.5 V
Lead Channel Setting Pacing Amplitude: 2.5 V
Lead Channel Setting Pacing Pulse Width: 0.4 ms
Lead Channel Setting Sensing Sensitivity: 1.2 mV

## 2019-04-19 ENCOUNTER — Encounter: Payer: Self-pay | Admitting: Cardiology

## 2019-04-19 NOTE — Progress Notes (Signed)
Remote pacemaker transmission.   

## 2019-07-11 ENCOUNTER — Ambulatory Visit (INDEPENDENT_AMBULATORY_CARE_PROVIDER_SITE_OTHER): Payer: Medicare Other | Admitting: *Deleted

## 2019-07-11 DIAGNOSIS — I495 Sick sinus syndrome: Secondary | ICD-10-CM | POA: Diagnosis not present

## 2019-07-11 LAB — CUP PACEART REMOTE DEVICE CHECK
Battery Remaining Longevity: 167 mo
Battery Voltage: 3.05 V
Brady Statistic AP VP Percent: 0.01 %
Brady Statistic AP VS Percent: 6.93 %
Brady Statistic AS VP Percent: 0.03 %
Brady Statistic AS VS Percent: 93.03 %
Brady Statistic RA Percent Paced: 7.12 %
Brady Statistic RV Percent Paced: 0.04 %
Date Time Interrogation Session: 20200914053958
Implantable Lead Implant Date: 20190226
Implantable Lead Implant Date: 20190226
Implantable Lead Location: 753859
Implantable Lead Location: 753860
Implantable Lead Model: 5076
Implantable Lead Model: 5076
Implantable Pulse Generator Implant Date: 20190226
Lead Channel Impedance Value: 342 Ohm
Lead Channel Impedance Value: 380 Ohm
Lead Channel Impedance Value: 456 Ohm
Lead Channel Impedance Value: 513 Ohm
Lead Channel Pacing Threshold Amplitude: 0.5 V
Lead Channel Pacing Threshold Amplitude: 0.625 V
Lead Channel Pacing Threshold Pulse Width: 0.4 ms
Lead Channel Pacing Threshold Pulse Width: 0.4 ms
Lead Channel Sensing Intrinsic Amplitude: 14.25 mV
Lead Channel Sensing Intrinsic Amplitude: 14.25 mV
Lead Channel Sensing Intrinsic Amplitude: 2.625 mV
Lead Channel Sensing Intrinsic Amplitude: 2.625 mV
Lead Channel Setting Pacing Amplitude: 1.5 V
Lead Channel Setting Pacing Amplitude: 2.5 V
Lead Channel Setting Pacing Pulse Width: 0.4 ms
Lead Channel Setting Sensing Sensitivity: 1.2 mV

## 2019-07-25 NOTE — Progress Notes (Signed)
Remote pacemaker transmission.   

## 2019-10-10 ENCOUNTER — Ambulatory Visit (INDEPENDENT_AMBULATORY_CARE_PROVIDER_SITE_OTHER): Payer: Medicare Other | Admitting: *Deleted

## 2019-10-10 DIAGNOSIS — I495 Sick sinus syndrome: Secondary | ICD-10-CM

## 2019-10-10 LAB — CUP PACEART REMOTE DEVICE CHECK
Battery Remaining Longevity: 164 mo
Battery Voltage: 3.04 V
Brady Statistic AP VP Percent: 0.01 %
Brady Statistic AP VS Percent: 5.21 %
Brady Statistic AS VP Percent: 0.04 %
Brady Statistic AS VS Percent: 94.75 %
Brady Statistic RA Percent Paced: 5.64 %
Brady Statistic RV Percent Paced: 0.05 %
Date Time Interrogation Session: 20201214004136
Implantable Lead Implant Date: 20190226
Implantable Lead Implant Date: 20190226
Implantable Lead Location: 753859
Implantable Lead Location: 753860
Implantable Lead Model: 5076
Implantable Lead Model: 5076
Implantable Pulse Generator Implant Date: 20190226
Lead Channel Impedance Value: 323 Ohm
Lead Channel Impedance Value: 380 Ohm
Lead Channel Impedance Value: 418 Ohm
Lead Channel Impedance Value: 513 Ohm
Lead Channel Pacing Threshold Amplitude: 0.5 V
Lead Channel Pacing Threshold Amplitude: 0.625 V
Lead Channel Pacing Threshold Pulse Width: 0.4 ms
Lead Channel Pacing Threshold Pulse Width: 0.4 ms
Lead Channel Sensing Intrinsic Amplitude: 14.25 mV
Lead Channel Sensing Intrinsic Amplitude: 14.25 mV
Lead Channel Sensing Intrinsic Amplitude: 2.625 mV
Lead Channel Sensing Intrinsic Amplitude: 2.625 mV
Lead Channel Setting Pacing Amplitude: 1.5 V
Lead Channel Setting Pacing Amplitude: 2.5 V
Lead Channel Setting Pacing Pulse Width: 0.4 ms
Lead Channel Setting Sensing Sensitivity: 1.2 mV

## 2019-10-14 ENCOUNTER — Other Ambulatory Visit: Payer: Self-pay

## 2019-10-14 DIAGNOSIS — C61 Malignant neoplasm of prostate: Secondary | ICD-10-CM

## 2019-11-12 NOTE — Progress Notes (Signed)
PPM remote 

## 2019-12-13 LAB — PSA: PSA: 14.3 ng/mL — ABNORMAL HIGH (ref <=4.0)

## 2019-12-20 ENCOUNTER — Encounter: Payer: Self-pay | Admitting: Urology

## 2019-12-20 ENCOUNTER — Other Ambulatory Visit: Payer: Self-pay

## 2019-12-20 ENCOUNTER — Ambulatory Visit (INDEPENDENT_AMBULATORY_CARE_PROVIDER_SITE_OTHER): Payer: Medicare Other | Admitting: Urology

## 2019-12-20 VITALS — BP 149/92 | HR 85 | Temp 97.0°F

## 2019-12-20 DIAGNOSIS — C61 Malignant neoplasm of prostate: Secondary | ICD-10-CM

## 2019-12-20 LAB — POCT URINALYSIS DIPSTICK
Bilirubin, UA: NEGATIVE
Blood, UA: NEGATIVE
Glucose, UA: NEGATIVE
Ketones, UA: NEGATIVE
Nitrite, UA: NEGATIVE
Protein, UA: NEGATIVE
Spec Grav, UA: 1.015 (ref 1.010–1.025)
Urobilinogen, UA: NEGATIVE E.U./dL — AB
pH, UA: 7 (ref 5.0–8.0)

## 2019-12-20 NOTE — Progress Notes (Signed)
H&P  Chief Complaint: Prostate Cancer  History of Present Illness:   2.23.2021: Most recent PSAs were 14.3 on 2.16.2021 and 10.0 on 9.4.2020. Here today for follow-up. He denies any significant voiding sx's and is not currently on any medications for his prostate. He denies any recent blood per urine or UTI. 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  IPSS Questionnaire (AUA-7): Over the past month.   1)  How often have you had a sensation of not emptying your bladder completely after you finish urinating?  0 - Not at all  2)  How often have you had to urinate again less than two hours after you finished urinating? 1 - Less than 1 time in 5  3)  How often have you found you stopped and started again several times when you urinated?  1 - Less than 1 time in 5  4) How difficult have you found it to postpone urination?  1 - Less than 1 time in 5  5) How often have you had a weak urinary stream?  0 - Not at all  6) How often have you had to push or strain to begin urination?  0 - Not at all  7) How many times did you most typically get up to urinate from the time you went to bed until the time you got up in the morning?  2 - 2 times  Total score:  0-7 mildly symptomatic   8-19 moderately symptomatic   20-35 severely symptomatic   Total: 5 QoL: 0  (below copied from [facility] records):  Prostate Cancer:  Mark Wolf is a 74 year-old male established patient who is here for prostate cancer which has been treated.  He is participating in active surveillance.   Prior to his presentation here in 2014, he had had 2 prior ultrasound and biopsies of his prostate by Dr. Maryland Pink, in 2004 in 2009. All cores were negative by report. His PSAs ran between 7 and 9. When I first saw him, I recommended that we perform a PCA 3 test. That was done on the post void urine, and returned  40, or positive. He underwent repeat ultrasound and biopsy of his prostate on 3.18.2014. 2/12 cores were positive for adenocarcinoma, both cores only had 5% involvement with Gleason 3+3 adenocarcinoma. Both positive cores were on the left side, one at the left base lateral, one at the left mid lateral. There was atypia found at the right mid medial biopsy. Prostatic volume was 42 cc. Because of his low Gleason score and cancer volume, the patient chose, following consultation, active surveillance.  Prior to enrolling him in active surveillance strategy, he underwent endorectal coil MRI. This revealed findings consistent with adenocarcinoma the left mid and lateral base. There was no evidence of extracapsular abnormality. There is no evidence of right-sided or apical disease.  He underwent repeat ultrasound and biopsy of his prostate on 4.14.2015. 1/12 cores showed adenocarcinoma of the prostate-Gleason 3+3, 10% of that core, at the left base lateral, was positive. The others were all negative.  Oncotype DX/GPS result--32 consistent with 68% chance of favorable pathology.  81% likelihood of low-grade disease  76% chance of organ confined disease   7.12.2019: MRI prostate. Volume 87 ml. No evidence of transcapsular spread/SV involvement/NV bundle involvement/pelvic adenopathy/bony mets. PIRADS 2-3 lesion medial left base   8.19.2019:  He presents for fusion Bx. Prostate volume 89.38 ml. All cores taken, including ROI cores were negative for PCa.   2.25.2020: He has had no urinary issues since his last visit. IPSS 0, quality of Life score 0. Most recent PSA was 8.6    07/01/19 12/16/18 06/14/18 08/05/17 01/29/17 08/06/16 02/06/16 08/08/15 02/14/15 08/16/14  Total PSA 10.0 ng/dL 8.6 ng/dL 9.09 ng/mL 9.7 ng/dl 7.8 ng/dl 8.2  7.47  10.45  8.66  9.42      Past Medical History:  Diagnosis Date  . Arthritis   . GERD (gastroesophageal reflux disease)   . Hyperlipidemia   . Hypertension   . Presence of  permanent cardiac pacemaker   . Prostate cancer ([place])   . Syncope    recurrent, unexplained,  implantable loop recorder placed    Past Surgical History:  Procedure Laterality Date  . INGUINAL HERNIA REPAIR Left   . INGUINAL HERNIA REPAIR Right 03/03/2018   Procedure: HERNIA REPAIR INGUINAL ADULT WITH MESH;  Surgeon: Aviva Signs, MD;  Location: [facility];  Service: General;  Laterality: Right;  . KNEE ARTHROSCOPY Left   . LOOP RECORDER INSERTION N/A 03/16/2017   MDT LINQ implanted for recurrent unexplained syncope  . LOOP RECORDER REMOVAL  12/22/2017   ILR removed by Dr Rayann Heman  . PACEMAKER IMPLANT N/A 12/22/2017   MDT Azure XT DR MRI conditional PPM implanted by Dr Rayann Heman for syncope and bradycardia    Home Medications:  Allergies as of 12/20/2019      Reactions   Other Other (See Comments)   "pain medication thinks its hydrocodone - made me pass out"   Oxycodone Other (See Comments)   Eyes rolled back in head      Medication List       Accurate as of December 20, 2019 11:43 AM. If you have any questions, ask your nurse or doctor.        STOP taking these medications   clindamycin 300 MG capsule Commonly known as: CLEOCIN Stopped by: Jorja Loa, MD     TAKE these medications   amLODipine 5 MG tablet Commonly known as: NORVASC Take 5 mg by mouth daily.   atorvastatin 20 MG tablet Commonly known as: LIPITOR Take 20 mg by mouth every evening.   famotidine 20 MG tablet Commonly known as: PEPCID Take 20 mg by mouth every evening.   NASOGEL NA Place 1 spray into the nose 2 (two) times daily. NeilMed Nasogel   omeprazole 20 MG capsule Commonly known as: PRILOSEC Take 20 mg by mouth daily.   triamcinolone 55 MCG/ACT Aero nasal inhaler Commonly known as: NASACORT Place into the nose.       Allergies:  Allergies  Allergen Reactions  . Other Other (See Comments)    "pain medication thinks its hydrocodone - made me pass out"  . Oxycodone Other (See  Comments)    Eyes rolled back in head    Family History  Problem Relation Age of Onset  . Aneurysm Mother   . Heart failure Father     Social History:  reports that he has never smoked. He has never used smokeless tobacco. He reports that he does not drink alcohol or use drugs.  ROS: A complete review of systems was performed.  All systems are negative except for pertinent findings as noted.  Physical Exam:  Vital signs in last 24 hours: BP (!) 149/92   Pulse 85   Temp (!) 97 F (36.1 C)  Constitutional:  Alert  and oriented, No acute distress Cardiovascular: Regular rate  Respiratory: Normal respiratory effort GI: Abdomen is soft, nontender, nondistended, no abdominal masses. No CVAT. No hernia. Genitourinary: Normal male phallus, testes are descended bilaterally and non-tender and without masses, scrotum is normal in appearance without lesions or masses, perineum is normal on inspection. Prostate feels around 90 grams in size. Lymphatic: No lymphadenopathy Neurologic: Grossly intact, no focal deficits Psychiatric: Normal mood and affect  Laboratory Data:  No results for input(s): WBC, HGB, HCT, PLT in the last 72 hours.  No results for input(s): NA, K, CL, GLUCOSE, BUN, CALCIUM, CREATININE in the last 72 hours.  Invalid input(s): CO3   Results for orders placed or performed in visit on 12/20/19 (from the past 24 hour(s))  POCT urinalysis dipstick     Status: Abnormal   Collection Time: 12/20/19 11:17 AM  Result Value Ref Range   Color, UA yellow    Clarity, UA clear    Glucose, UA Negative Negative   Bilirubin, UA neg    Ketones, UA neg    Spec Grav, UA 1.015 1.010 - 1.025   Blood, UA neg    pH, UA 7.0 5.0 - 8.0   Protein, UA Negative Negative   Urobilinogen, UA negative (A) 0.2 or 1.0 E.U./dL   Nitrite, UA neg    Leukocytes, UA Trace (A) Negative   Appearance     Odor     No results found for this or any previous visit (from the past 240 hour(s)).  Renal  Function: No results for input(s): CREATININE in the last 168 hours. CrCl cannot be calculated (Patient's most recent lab result is older than the maximum 21 days allowed.).  Radiologic Imaging: No results found.  Impression/Assessment:  His most recent PSA is elevated above his baseline (now up to 14.3). This is higher for him, but given his recent negative evaluations I do not think we need to repeat an MRI at this point -- I will recheck in 6 mo's and determine appropriate follow-up pending those results. Prostate exam normal.  Plan:  1. Return in 6 mo for OV w/ PSA  2. Return for lab visit only (PSA) in 3 mo's.   3. Pending results, will plan appropriate follow-up. If it remains elevated or continues to increase I believe a repeat prostate MRI would be appropriate.

## 2019-12-20 NOTE — Progress Notes (Signed)

## 2019-12-23 ENCOUNTER — Ambulatory Visit: Payer: Medicare Other

## 2020-01-09 ENCOUNTER — Ambulatory Visit (INDEPENDENT_AMBULATORY_CARE_PROVIDER_SITE_OTHER): Payer: Medicare Other | Admitting: *Deleted

## 2020-01-09 DIAGNOSIS — I495 Sick sinus syndrome: Secondary | ICD-10-CM

## 2020-01-10 LAB — CUP PACEART REMOTE DEVICE CHECK
Battery Remaining Longevity: 161 mo
Battery Voltage: 3.04 V
Brady Statistic AP VP Percent: 0.01 %
Brady Statistic AP VS Percent: 2.01 %
Brady Statistic AS VP Percent: 0.03 %
Brady Statistic AS VS Percent: 97.95 %
Brady Statistic RA Percent Paced: 2.07 %
Brady Statistic RV Percent Paced: 0.04 %
Date Time Interrogation Session: 20210316020417
Implantable Lead Implant Date: 20190226
Implantable Lead Implant Date: 20190226
Implantable Lead Location: 753859
Implantable Lead Location: 753860
Implantable Lead Model: 5076
Implantable Lead Model: 5076
Implantable Pulse Generator Implant Date: 20190226
Lead Channel Impedance Value: 342 Ohm
Lead Channel Impedance Value: 399 Ohm
Lead Channel Impedance Value: 456 Ohm
Lead Channel Impedance Value: 532 Ohm
Lead Channel Pacing Threshold Amplitude: 0.5 V
Lead Channel Pacing Threshold Amplitude: 0.625 V
Lead Channel Pacing Threshold Pulse Width: 0.4 ms
Lead Channel Pacing Threshold Pulse Width: 0.4 ms
Lead Channel Sensing Intrinsic Amplitude: 18 mV
Lead Channel Sensing Intrinsic Amplitude: 18 mV
Lead Channel Sensing Intrinsic Amplitude: 3.875 mV
Lead Channel Sensing Intrinsic Amplitude: 3.875 mV
Lead Channel Setting Pacing Amplitude: 1.5 V
Lead Channel Setting Pacing Amplitude: 2.5 V
Lead Channel Setting Pacing Pulse Width: 0.4 ms
Lead Channel Setting Sensing Sensitivity: 1.2 mV

## 2020-01-10 NOTE — Progress Notes (Signed)
PPM Remote  

## 2020-03-23 ENCOUNTER — Other Ambulatory Visit: Payer: Self-pay | Admitting: Urology

## 2020-03-24 LAB — PSA: PSA: 10.1 ng/mL — ABNORMAL HIGH (ref <=4.0)

## 2020-03-30 ENCOUNTER — Encounter: Payer: Medicare Other | Admitting: Internal Medicine

## 2020-04-04 NOTE — Progress Notes (Signed)
Pt.notified

## 2020-04-09 ENCOUNTER — Ambulatory Visit (INDEPENDENT_AMBULATORY_CARE_PROVIDER_SITE_OTHER): Payer: Medicare Other | Admitting: *Deleted

## 2020-04-09 DIAGNOSIS — I495 Sick sinus syndrome: Secondary | ICD-10-CM

## 2020-04-10 LAB — CUP PACEART REMOTE DEVICE CHECK
Battery Remaining Longevity: 157 mo
Battery Voltage: 3.03 V
Brady Statistic AP VP Percent: 0.01 %
Brady Statistic AP VS Percent: 15.95 %
Brady Statistic AS VP Percent: 0.04 %
Brady Statistic AS VS Percent: 84 %
Brady Statistic RA Percent Paced: 15.85 %
Brady Statistic RV Percent Paced: 0.05 %
Date Time Interrogation Session: 20210614014124
Implantable Lead Implant Date: 20190226
Implantable Lead Implant Date: 20190226
Implantable Lead Location: 753859
Implantable Lead Location: 753860
Implantable Lead Model: 5076
Implantable Lead Model: 5076
Implantable Pulse Generator Implant Date: 20190226
Lead Channel Impedance Value: 323 Ohm
Lead Channel Impedance Value: 380 Ohm
Lead Channel Impedance Value: 456 Ohm
Lead Channel Impedance Value: 532 Ohm
Lead Channel Pacing Threshold Amplitude: 0.5 V
Lead Channel Pacing Threshold Amplitude: 0.5 V
Lead Channel Pacing Threshold Pulse Width: 0.4 ms
Lead Channel Pacing Threshold Pulse Width: 0.4 ms
Lead Channel Sensing Intrinsic Amplitude: 13 mV
Lead Channel Sensing Intrinsic Amplitude: 13 mV
Lead Channel Sensing Intrinsic Amplitude: 2.375 mV
Lead Channel Sensing Intrinsic Amplitude: 2.375 mV
Lead Channel Setting Pacing Amplitude: 1.5 V
Lead Channel Setting Pacing Amplitude: 2.5 V
Lead Channel Setting Pacing Pulse Width: 0.4 ms
Lead Channel Setting Sensing Sensitivity: 1.2 mV

## 2020-04-10 NOTE — Progress Notes (Signed)
Remote pacemaker transmission.   

## 2020-04-20 ENCOUNTER — Other Ambulatory Visit: Payer: Self-pay

## 2020-04-20 ENCOUNTER — Ambulatory Visit (INDEPENDENT_AMBULATORY_CARE_PROVIDER_SITE_OTHER): Payer: Medicare Other | Admitting: Internal Medicine

## 2020-04-20 ENCOUNTER — Encounter: Payer: Self-pay | Admitting: Internal Medicine

## 2020-04-20 VITALS — BP 120/70 | HR 66 | Ht 71.0 in | Wt 198.0 lb

## 2020-04-20 DIAGNOSIS — I10 Essential (primary) hypertension: Secondary | ICD-10-CM

## 2020-04-20 DIAGNOSIS — I495 Sick sinus syndrome: Secondary | ICD-10-CM

## 2020-04-20 LAB — CUP PACEART INCLINIC DEVICE CHECK
Battery Remaining Longevity: 156 mo
Battery Voltage: 3.03 V
Brady Statistic AP VP Percent: 0.01 %
Brady Statistic AP VS Percent: 7.52 %
Brady Statistic AS VP Percent: 0.04 %
Brady Statistic AS VS Percent: 92.44 %
Brady Statistic RA Percent Paced: 7.66 %
Brady Statistic RV Percent Paced: 0.04 %
Date Time Interrogation Session: 20210625095818
Implantable Lead Implant Date: 20190226
Implantable Lead Implant Date: 20190226
Implantable Lead Location: 753859
Implantable Lead Location: 753860
Implantable Lead Model: 5076
Implantable Lead Model: 5076
Implantable Pulse Generator Implant Date: 20190226
Lead Channel Impedance Value: 323 Ohm
Lead Channel Impedance Value: 380 Ohm
Lead Channel Impedance Value: 437 Ohm
Lead Channel Impedance Value: 513 Ohm
Lead Channel Pacing Threshold Amplitude: 0.5 V
Lead Channel Pacing Threshold Amplitude: 0.625 V
Lead Channel Pacing Threshold Pulse Width: 0.4 ms
Lead Channel Pacing Threshold Pulse Width: 0.4 ms
Lead Channel Sensing Intrinsic Amplitude: 0.75 mV
Lead Channel Sensing Intrinsic Amplitude: 13.625 mV
Lead Channel Sensing Intrinsic Amplitude: 14.75 mV
Lead Channel Sensing Intrinsic Amplitude: 4.5 mV
Lead Channel Setting Pacing Amplitude: 1.5 V
Lead Channel Setting Pacing Amplitude: 2.5 V
Lead Channel Setting Pacing Pulse Width: 0.4 ms
Lead Channel Setting Sensing Sensitivity: 1.2 mV

## 2020-04-20 NOTE — Progress Notes (Signed)
    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 male who presents today for routine electrophysiology followup.  Since last being seen in our clinic, the patient reports doing very well.  Today, he denies symptoms of palpitations, chest pain, shortness of breath,  lower extremity edema, dizziness, presyncope, or syncope.  The patient is otherwise without complaint today.   Past Medical History:  Diagnosis Date  . Arthritis   . GERD (gastroesophageal reflux disease)   . Hyperlipidemia   . Hypertension   . Presence of permanent cardiac pacemaker   . Prostate cancer ([place])   . Syncope    recurrent, unexplained,  implantable loop recorder placed   Past Surgical History:  Procedure Laterality Date  . INGUINAL HERNIA REPAIR Left   . INGUINAL HERNIA REPAIR Right 03/03/2018   Procedure: HERNIA REPAIR INGUINAL ADULT WITH MESH;  Surgeon: Aviva Signs, MD;  Location: [facility];  Service: General;  Laterality: Right;  . KNEE ARTHROSCOPY Left   . LOOP RECORDER INSERTION N/A 03/16/2017   MDT LINQ implanted for recurrent unexplained syncope  . LOOP RECORDER REMOVAL  12/22/2017   ILR removed by Dr Rayann Heman  . PACEMAKER IMPLANT N/A 12/22/2017   MDT Azure XT DR MRI conditional PPM implanted by Dr Rayann Heman for syncope and bradycardia    ROS- all systems are reviewed and negative except as per HPI above  Current Outpatient Medications  Medication Sig Dispense Refill  . amLODipine (NORVASC) 5 MG tablet Take 5 mg by mouth daily.    Marland Kitchen atorvastatin (LIPITOR) 20 MG tablet Take 20 mg by mouth every evening.     . famotidine (PEPCID) 20 MG tablet Take 20 mg by mouth every evening.     Marland Kitchen omeprazole (PRILOSEC) 20 MG capsule Take 20 mg by mouth daily.    . Saline (NASOGEL NA) Place 1 spray into the nose 2 (two) times daily. NeilMed Nasogel    . triamcinolone (NASACORT) 55 MCG/ACT AERO nasal inhaler Place into the nose.     No current facility-administered medications for this  visit.    Physical Exam: Vitals:   04/20/20 0931  BP: 120/70  Pulse: 66  Weight: 198 lb (89.8 kg)  Height: 5\' 11"  (1.803 m)    GEN- The patient is well appearing, alert and oriented x 3 today.   Head- normocephalic, atraumatic Eyes-  Sclera clear, conjunctiva pink Ears- hearing intact Oropharynx- clear Lungs-  normal work of breathing Chest- pacemaker pocket is well healed Heart- Regular rate and rhythm  GI- soft  Extremities- no clubbing, cyanosis, or edema  Pacemaker interrogation- reviewed in detail today,  See PACEART report  ekg tracing ordered today is personally reviewed and shows sinus  Assessment and Plan:  1. Symptomatic sinus bradycardia  Normal pacemaker function See Pace Art report No changes today he is not device dependant today  2. HTN Stable No change required today  3. HL Continue statin  Return in a year  Risks, benefits and potential toxicities for medications prescribed and/or refilled reviewed with patient today.   Thompson Grayer MD, [facility] 04/20/2020 9:58 AM

## 2020-04-20 NOTE — Patient Instructions (Signed)
Medication Instructions:   Your physician recommends that you continue on your current medications as directed. Please refer to the Current Medication list given to you today.  Labwork:  NONE  Testing/Procedures:  NONE  Follow-Up: Your physician recommends that you schedule a follow-up appointment in: 1 year (office). Please schedule this appointment today before leaving the office.   Any Other Special Instructions Will Be Listed Below (If Applicable).  If you need a refill on your cardiac medications before your next appointment, please call your pharmacy. 

## 2020-05-25 ENCOUNTER — Other Ambulatory Visit: Payer: Self-pay

## 2020-05-25 DIAGNOSIS — C61 Malignant neoplasm of prostate: Secondary | ICD-10-CM

## 2020-06-14 ENCOUNTER — Other Ambulatory Visit: Payer: Medicare Other

## 2020-06-14 ENCOUNTER — Other Ambulatory Visit: Payer: Self-pay

## 2020-06-15 LAB — PSA: Prostate Specific Ag, Serum: 10.3 ng/mL — ABNORMAL HIGH (ref 0.0–4.0)

## 2020-06-19 ENCOUNTER — Ambulatory Visit (INDEPENDENT_AMBULATORY_CARE_PROVIDER_SITE_OTHER): Payer: Medicare Other | Admitting: Urology

## 2020-06-19 ENCOUNTER — Encounter: Payer: Self-pay | Admitting: Urology

## 2020-06-19 ENCOUNTER — Other Ambulatory Visit: Payer: Self-pay

## 2020-06-19 ENCOUNTER — Ambulatory Visit: Payer: Medicare Other | Admitting: Urology

## 2020-06-19 VITALS — BP 134/81 | HR 84 | Temp 97.6°F | Ht 71.0 in | Wt 198.0 lb

## 2020-06-19 DIAGNOSIS — C61 Malignant neoplasm of prostate: Secondary | ICD-10-CM | POA: Diagnosis not present

## 2020-06-19 LAB — URINALYSIS, ROUTINE W REFLEX MICROSCOPIC
Bilirubin, UA: NEGATIVE
Glucose, UA: NEGATIVE
Ketones, UA: NEGATIVE
Leukocytes,UA: NEGATIVE
Nitrite, UA: NEGATIVE
Protein,UA: NEGATIVE
RBC, UA: NEGATIVE
Specific Gravity, UA: 1.015 (ref 1.005–1.030)
Urobilinogen, Ur: 0.2 mg/dL (ref 0.2–1.0)
pH, UA: 5 (ref 5.0–7.5)

## 2020-06-19 NOTE — Progress Notes (Signed)
H&P  Chief Complaint: Prostate Cancer  History of Present Illness:   8.24.2021: PSA - 10.3  PCa: Pt is under active surveillance for PCa and submits no concerns or complaints at this time.   ED: Pt submits that both sildenafil and tadalafil cause the sniffles and is interested in alternative ED treatment.  (below copied from [place] records):  Mark Wolf is a 74 year-old male established patient who is here for prostate cancer which has been treated.  He is participating in active surveillance.   Prior to his presentation here in 2014, he had had 2 prior ultrasound and biopsies of his prostate by Dr. [place] Pink, in 2004 in 2009. All cores were negative by report. His PSAs ran between 7 and 9. When I first saw him, I recommended that we perform a PCA 3 test. That was done on the post void urine, and returned 40, or positive. He underwent repeat ultrasound and biopsy of his prostate on 3.18.2014. 2/12 cores were positive for adenocarcinoma, both cores only had 5% involvement with Gleason 3+3 adenocarcinoma. Both positive cores were on the left side, one at the left base lateral, one at the left mid lateral. There was atypia found at the right mid medial biopsy. Prostatic volume was 42 cc. Because of his low Gleason score and cancer volume, the patient chose, following consultation, active surveillance.  Prior to enrolling him in active surveillance strategy, he underwent endorectal coil MRI. This revealed findings consistent with adenocarcinoma the left mid and lateral base. There was no evidence of extracapsular abnormality. There is no evidence of right-sided or apical disease.  He underwent repeat ultrasound and biopsy of his prostate on 4.14.2015. 1/12 cores showed adenocarcinoma of the prostate-Gleason 3+3, 10% of that core, at the left base lateral, was positive. The others were all negative.  Oncotype DX/GPS result--32 consistent with 68% chance of favorable pathology.  81% likelihood of  low-grade disease  76% chance of organ confined disease   7.12.2019: MRI prostate. Volume 87 ml. No evidence of transcapsular spread/SV involvement/NV bundle involvement/pelvic adenopathy/bony mets. PIRADS 2-3 lesion medial left base   8.19.2019: He presents for fusion Bx. Prostate volume 89.38 ml. All cores taken, including ROI cores were negative for PCa.   2.25.2020: PSA 8.6  2.23.2021: Most recent PSAs were 14.3 on 2.16.2021 and 10.0 on 9.4.2020.   Past Medical History:  Diagnosis Date  . Arthritis   . GERD (gastroesophageal reflux disease)   . Hyperlipidemia   . Hypertension   . Presence of permanent cardiac pacemaker   . Prostate cancer ([place])   . Syncope    recurrent, unexplained,  implantable loop recorder placed    Past Surgical History:  Procedure Laterality Date  . INGUINAL HERNIA REPAIR Left   . INGUINAL HERNIA REPAIR Right 03/03/2018   Procedure: HERNIA REPAIR INGUINAL ADULT WITH MESH;  Surgeon: Aviva Signs, MD;  Location: [facility];  Service: General;  Laterality: Right;  . KNEE ARTHROSCOPY Left   . LOOP RECORDER INSERTION N/A 03/16/2017   MDT LINQ implanted for recurrent unexplained syncope  . LOOP RECORDER REMOVAL  12/22/2017   ILR removed by Dr Rayann Heman  . PACEMAKER IMPLANT N/A 12/22/2017   MDT Azure XT DR MRI conditional PPM implanted by Dr Rayann Heman for syncope and bradycardia    Home Medications:  Allergies as of 06/19/2020      Reactions   Other Other (See Comments)   "pain medication thinks its hydrocodone - made me pass out"   Oxycodone Other (See  Comments)   Eyes rolled back in head      Medication List       Accurate as of June 19, 2020  9:20 AM. If you have any questions, ask your nurse or doctor.        amLODipine 5 MG tablet Commonly known as: NORVASC Take 5 mg by mouth daily.   atorvastatin 20 MG tablet Commonly known as: LIPITOR Take 20 mg by mouth every evening.   famotidine 20 MG tablet Commonly known as: PEPCID Take 20 mg by  mouth every evening.   NASOGEL NA Place 1 spray into the nose 2 (two) times daily. NeilMed Nasogel   omeprazole 20 MG capsule Commonly known as: PRILOSEC Take 20 mg by mouth daily.   triamcinolone 55 MCG/ACT Aero nasal inhaler Commonly known as: NASACORT Place into the nose.       Allergies:  Allergies  Allergen Reactions  . Other Other (See Comments)    "pain medication thinks its hydrocodone - made me pass out"  . Oxycodone Other (See Comments)    Eyes rolled back in head    Family History  Problem Relation Age of Onset  . Aneurysm Mother   . Heart failure Father     Social History:  reports that he has never smoked. He has never used smokeless tobacco. He reports that he does not drink alcohol and does not use drugs.  ROS: A complete review of systems was performed.  All systems are negative except for pertinent findings as noted.  Physical Exam:  Vital signs in last 24 hours: There were no vitals taken for this visit. Constitutional:  Alert and oriented, No acute distress Cardiovascular: Regular rate  Respiratory: Normal respiratory effort GI: Abdomen is soft, nontender, nondistended, no abdominal masses. No CVAT. Bilateral Hernia repair Genitourinary: Normal male phallus, testes are descended bilaterally and non-tender and without masses, scrotum is normal in appearance without lesions or masses, perineum is normal on inspection. Prostate feels like 80 grams. Neurologic: Grossly intact, no focal deficits Psychiatric: Normal mood and affect  I have reviewed prior pt notes  I have reviewed notes from referring/previous physicians  I have reviewed urinalysis results  I have reviewed prior PSA results  Impression/Assessment:  Low risk prostate cancer with most recent/second surveillance biopsy negative.  PSA and DRE are stable  Plan:  1. Pt continued on active surveillance for PCa.  2. F/U in 6 months for symptom/PSA recheck.  3.  I did offer him  [place] Citizen as a PDE 5 inhibitor.  He would like to hold off on that for now.  CC: Dr. Huel Cote

## 2020-06-19 NOTE — Progress Notes (Signed)

## 2020-07-09 ENCOUNTER — Ambulatory Visit (INDEPENDENT_AMBULATORY_CARE_PROVIDER_SITE_OTHER): Payer: Medicare Other | Admitting: *Deleted

## 2020-07-09 DIAGNOSIS — I495 Sick sinus syndrome: Secondary | ICD-10-CM | POA: Diagnosis not present

## 2020-07-10 LAB — CUP PACEART REMOTE DEVICE CHECK
Battery Remaining Longevity: 154 mo
Battery Voltage: 3.03 V
Brady Statistic AP VP Percent: 0 %
Brady Statistic AP VS Percent: 2.61 %
Brady Statistic AS VP Percent: 0.04 %
Brady Statistic AS VS Percent: 97.35 %
Brady Statistic RA Percent Paced: 2.62 %
Brady Statistic RV Percent Paced: 0.04 %
Date Time Interrogation Session: 20210913013350
Implantable Lead Implant Date: 20190226
Implantable Lead Implant Date: 20190226
Implantable Lead Location: 753859
Implantable Lead Location: 753860
Implantable Lead Model: 5076
Implantable Lead Model: 5076
Implantable Pulse Generator Implant Date: 20190226
Lead Channel Impedance Value: 342 Ohm
Lead Channel Impedance Value: 380 Ohm
Lead Channel Impedance Value: 437 Ohm
Lead Channel Impedance Value: 513 Ohm
Lead Channel Pacing Threshold Amplitude: 0.5 V
Lead Channel Pacing Threshold Amplitude: 0.625 V
Lead Channel Pacing Threshold Pulse Width: 0.4 ms
Lead Channel Pacing Threshold Pulse Width: 0.4 ms
Lead Channel Sensing Intrinsic Amplitude: 12.75 mV
Lead Channel Sensing Intrinsic Amplitude: 12.75 mV
Lead Channel Sensing Intrinsic Amplitude: 2.375 mV
Lead Channel Sensing Intrinsic Amplitude: 2.375 mV
Lead Channel Setting Pacing Amplitude: 1.5 V
Lead Channel Setting Pacing Amplitude: 2.5 V
Lead Channel Setting Pacing Pulse Width: 0.4 ms
Lead Channel Setting Sensing Sensitivity: 1.2 mV

## 2020-07-11 NOTE — Progress Notes (Signed)
Remote pacemaker transmission.   

## 2020-10-08 ENCOUNTER — Ambulatory Visit (INDEPENDENT_AMBULATORY_CARE_PROVIDER_SITE_OTHER): Payer: Medicare Other

## 2020-10-08 DIAGNOSIS — I495 Sick sinus syndrome: Secondary | ICD-10-CM

## 2020-10-09 LAB — CUP PACEART REMOTE DEVICE CHECK
Battery Remaining Longevity: 151 mo
Battery Voltage: 3.03 V
Brady Statistic AP VP Percent: 0 %
Brady Statistic AP VS Percent: 2.02 %
Brady Statistic AS VP Percent: 0.04 %
Brady Statistic AS VS Percent: 97.93 %
Brady Statistic RA Percent Paced: 2.04 %
Brady Statistic RV Percent Paced: 0.04 %
Date Time Interrogation Session: 20211214002313
Implantable Lead Implant Date: 20190226
Implantable Lead Implant Date: 20190226
Implantable Lead Location: 753859
Implantable Lead Location: 753860
Implantable Lead Model: 5076
Implantable Lead Model: 5076
Implantable Pulse Generator Implant Date: 20190226
Lead Channel Impedance Value: 323 Ohm
Lead Channel Impedance Value: 380 Ohm
Lead Channel Impedance Value: 418 Ohm
Lead Channel Impedance Value: 494 Ohm
Lead Channel Pacing Threshold Amplitude: 0.5 V
Lead Channel Pacing Threshold Amplitude: 0.5 V
Lead Channel Pacing Threshold Pulse Width: 0.4 ms
Lead Channel Pacing Threshold Pulse Width: 0.4 ms
Lead Channel Sensing Intrinsic Amplitude: 12.875 mV
Lead Channel Sensing Intrinsic Amplitude: 12.875 mV
Lead Channel Sensing Intrinsic Amplitude: 3.125 mV
Lead Channel Sensing Intrinsic Amplitude: 3.125 mV
Lead Channel Setting Pacing Amplitude: 1.5 V
Lead Channel Setting Pacing Amplitude: 2.5 V
Lead Channel Setting Pacing Pulse Width: 0.4 ms
Lead Channel Setting Sensing Sensitivity: 1.2 mV

## 2020-10-23 NOTE — Progress Notes (Signed)
Remote pacemaker transmission.   

## 2020-12-18 ENCOUNTER — Ambulatory Visit: Payer: Medicare Other | Admitting: Urology

## 2020-12-18 ENCOUNTER — Other Ambulatory Visit: Payer: Self-pay | Admitting: Urology

## 2020-12-18 ENCOUNTER — Other Ambulatory Visit: Payer: Medicare Other

## 2020-12-18 ENCOUNTER — Other Ambulatory Visit: Payer: Self-pay

## 2020-12-19 LAB — PSA: Prostate Specific Ag, Serum: 17.2 ng/mL — ABNORMAL HIGH (ref 0.0–4.0)

## 2020-12-25 ENCOUNTER — Encounter: Payer: Self-pay | Admitting: Urology

## 2020-12-25 ENCOUNTER — Other Ambulatory Visit: Payer: Self-pay

## 2020-12-25 ENCOUNTER — Ambulatory Visit (INDEPENDENT_AMBULATORY_CARE_PROVIDER_SITE_OTHER): Payer: Medicare Other | Admitting: Urology

## 2020-12-25 VITALS — BP 135/79 | HR 88 | Temp 98.5°F | Ht 71.0 in | Wt 200.0 lb

## 2020-12-25 DIAGNOSIS — C61 Malignant neoplasm of prostate: Secondary | ICD-10-CM

## 2020-12-25 DIAGNOSIS — N400 Enlarged prostate without lower urinary tract symptoms: Secondary | ICD-10-CM | POA: Diagnosis not present

## 2020-12-25 LAB — URINALYSIS, ROUTINE W REFLEX MICROSCOPIC
Bilirubin, UA: NEGATIVE
Glucose, UA: NEGATIVE
Leukocytes,UA: NEGATIVE
Nitrite, UA: NEGATIVE
RBC, UA: NEGATIVE
Specific Gravity, UA: 1.02 (ref 1.005–1.030)
Urobilinogen, Ur: 0.2 mg/dL (ref 0.2–1.0)
pH, UA: 6 (ref 5.0–7.5)

## 2020-12-25 NOTE — Progress Notes (Signed)
History of Present Illness:   Prior to his presentation here in 2014, he had had 2 prior TRUS/Bxies by Dr. [place] Pink, in 2004 in 2009. All cores were negative by report. His PSAs ran between 7 and 9. AAt 1st visit PCA 3 test was 40/positive. Repeat TRUS/Bx 3.18.2014. 2/12 cores were positive for adenocarcinoma, both cores only had 5% involvement with GS 3+3 adenocarcinoma. Both positive cores were  left base lateral and left mid lateral. There was atypia found at the right mid medial biopsy. Prostatic volume was 42 cc. He elected AS. Prior to enrolling him in active surveillance strategy, he underwent endorectal coil MRI. This revealed findings consistent with adenocarcinoma the left mid and lateral base. There was no evidence of extracapsular abnormality. There is no evidence of right-sided or apical disease.  He underwent surveillance TRUS/Bx on 4.14.2015. 1/12 cores showed adenocarcinoma of the prostate-Gleason 3+3, 10% of that core, at the left base lateral, was positive. The others were all negative.  Oncotype DX/GPS result--32 consistent with 68% chance of favorable pathology.  81% likelihood of low-grade disease  76% chance of organ confined disease   7.12.2019: MRI prostate. Volume 87 ml. No evidence of transcapsular spread/SV involvement/NV bundle involvement/pelvic adenopathy/bony mets. PIRADS 2-3 lesion medial left base   8.19.2019:  fusion Bx. Prostate volume 89.38 ml. All cores taken, including ROI cores were negative for PCa.  2.25.2020: PSA 8.6 9.4.2020: PSA 10.0 2.16.2021: PSA 14.3  2.23.2021: PSA 17.2.   Past Medical History:  Diagnosis Date  . Arthritis   . GERD (gastroesophageal reflux disease)   . Hyperlipidemia   . Hypertension   . Presence of permanent cardiac pacemaker   . Prostate cancer ([place])   . Syncope    recurrent, unexplained,  implantable loop recorder placed    Past Surgical History:  Procedure Laterality Date  . INGUINAL HERNIA REPAIR Left   .  INGUINAL HERNIA REPAIR Right 03/03/2018   Procedure: HERNIA REPAIR INGUINAL ADULT WITH MESH;  Surgeon: Aviva Signs, MD;  Location: [facility];  Service: General;  Laterality: Right;  . KNEE ARTHROSCOPY Left   . LOOP RECORDER INSERTION N/A 03/16/2017   MDT LINQ implanted for recurrent unexplained syncope  . LOOP RECORDER REMOVAL  12/22/2017   ILR removed by Dr Rayann Heman  . PACEMAKER IMPLANT N/A 12/22/2017   MDT Azure XT DR MRI conditional PPM implanted by Dr Rayann Heman for syncope and bradycardia    Home Medications:  Allergies as of 12/25/2020      Reactions   Other Other (See Comments)   "pain medication thinks its hydrocodone - made me pass out"   Oxycodone Other (See Comments)   Eyes rolled back in head      Medication List       Accurate as of December 25, 2020  9:05 AM. If you have any questions, ask your nurse or doctor.        amLODipine 5 MG tablet Commonly known as: NORVASC Take 5 mg by mouth daily.   atorvastatin 20 MG tablet Commonly known as: LIPITOR Take 20 mg by mouth every evening.   famotidine 20 MG tablet Commonly known as: PEPCID Take 20 mg by mouth every evening.   NASOGEL NA Place 1 spray into the nose 2 (two) times daily. NeilMed Nasogel   omeprazole 20 MG capsule Commonly known as: PRILOSEC Take 20 mg by mouth daily.   triamcinolone 55 MCG/ACT Aero nasal inhaler Commonly known as: NASACORT Place into the nose.  Allergies:  Allergies  Allergen Reactions  . Other Other (See Comments)    "pain medication thinks its hydrocodone - made me pass out"  . Oxycodone Other (See Comments)    Eyes rolled back in head    Family History  Problem Relation Age of Onset  . Aneurysm Mother   . Heart failure Father     Social History:  reports that he has never smoked. He has never used smokeless tobacco. He reports that he does not drink alcohol and does not use drugs.  ROS: A complete review of systems was performed.  All systems are negative except for  pertinent findings as noted.  Physical Exam:  Vital signs in last 24 hours: There were no vitals taken for this visit. Constitutional:  Alert and oriented, No acute distress Cardiovascular: Regular rate  Respiratory: Normal respiratory effort GI: Abdomen is soft, nontender, nondistended, no abdominal masses. No CVAT.  Genitourinary: Normal male phallus, testes are descended bilaterally and non-tender and without masses, scrotum is normal in appearance without lesions or masses, perineum is normal on inspection.  Prostate was enlarged, 90 g, symmetrical without nodularity or tenderness. Lymphatic: No lymphadenopathy Neurologic: Grossly intact, no focal deficits Psychiatric: Normal mood and affect  I have reviewed prior pt notes  I have reviewed notes from referring/previous physicians  I have reviewed urinalysis results  I have independently reviewed prior imaging  I have reviewed prior PSA/pathology results   Impression/Assessment:  1.  Low risk prostate cancer, on active surveillance with recent bump in his PSA to 17.  Most recent MRI 3 years ago showed no significant suspicious areas  2.  BPH, quite large gland without significant symptomatology  Plan:  1.  I will have him come back just for a PSA in 3 months  2.  We will call with that result.  If significant continued elevation, I will perform MRI followed by biopsy.  If the PSA is back down, we will consider going on without repeat MRI

## 2021-01-07 ENCOUNTER — Ambulatory Visit (INDEPENDENT_AMBULATORY_CARE_PROVIDER_SITE_OTHER): Payer: Medicare Other

## 2021-01-07 DIAGNOSIS — I495 Sick sinus syndrome: Secondary | ICD-10-CM | POA: Diagnosis not present

## 2021-01-09 LAB — CUP PACEART REMOTE DEVICE CHECK
Battery Remaining Longevity: 148 mo
Battery Voltage: 3.03 V
Brady Statistic AP VP Percent: 0 %
Brady Statistic AP VS Percent: 0.83 %
Brady Statistic AS VP Percent: 0.03 %
Brady Statistic AS VS Percent: 99.13 %
Brady Statistic RA Percent Paced: 0.9 %
Brady Statistic RV Percent Paced: 0.04 %
Date Time Interrogation Session: 20220314014208
Implantable Lead Implant Date: 20190226
Implantable Lead Implant Date: 20190226
Implantable Lead Location: 753859
Implantable Lead Location: 753860
Implantable Lead Model: 5076
Implantable Lead Model: 5076
Implantable Pulse Generator Implant Date: 20190226
Lead Channel Impedance Value: 323 Ohm
Lead Channel Impedance Value: 380 Ohm
Lead Channel Impedance Value: 418 Ohm
Lead Channel Impedance Value: 513 Ohm
Lead Channel Pacing Threshold Amplitude: 0.5 V
Lead Channel Pacing Threshold Amplitude: 0.625 V
Lead Channel Pacing Threshold Pulse Width: 0.4 ms
Lead Channel Pacing Threshold Pulse Width: 0.4 ms
Lead Channel Sensing Intrinsic Amplitude: 12.5 mV
Lead Channel Sensing Intrinsic Amplitude: 12.5 mV
Lead Channel Sensing Intrinsic Amplitude: 2.75 mV
Lead Channel Sensing Intrinsic Amplitude: 2.75 mV
Lead Channel Setting Pacing Amplitude: 1.5 V
Lead Channel Setting Pacing Amplitude: 2.5 V
Lead Channel Setting Pacing Pulse Width: 0.4 ms
Lead Channel Setting Sensing Sensitivity: 1.2 mV

## 2021-01-15 NOTE — Progress Notes (Signed)
Remote pacemaker transmission.   

## 2021-03-27 ENCOUNTER — Other Ambulatory Visit: Payer: Self-pay

## 2021-03-27 ENCOUNTER — Other Ambulatory Visit: Payer: Medicare Other

## 2021-03-27 DIAGNOSIS — C61 Malignant neoplasm of prostate: Secondary | ICD-10-CM

## 2021-03-28 LAB — PSA: Prostate Specific Ag, Serum: 10.1 ng/mL — ABNORMAL HIGH (ref 0.0–4.0)

## 2021-03-29 ENCOUNTER — Encounter: Payer: Medicare Other | Admitting: Internal Medicine

## 2021-04-01 DIAGNOSIS — C61 Malignant neoplasm of prostate: Secondary | ICD-10-CM

## 2021-04-01 NOTE — Progress Notes (Signed)
Results printed and mailed with new future appointments.

## 2021-04-05 ENCOUNTER — Encounter: Payer: Self-pay | Admitting: Internal Medicine

## 2021-04-05 ENCOUNTER — Other Ambulatory Visit: Payer: Self-pay

## 2021-04-05 ENCOUNTER — Ambulatory Visit (INDEPENDENT_AMBULATORY_CARE_PROVIDER_SITE_OTHER): Payer: Medicare Other | Admitting: Internal Medicine

## 2021-04-05 VITALS — BP 136/72 | HR 80 | Ht 71.0 in | Wt 202.0 lb

## 2021-04-05 DIAGNOSIS — I10 Essential (primary) hypertension: Secondary | ICD-10-CM | POA: Diagnosis not present

## 2021-04-05 DIAGNOSIS — I495 Sick sinus syndrome: Secondary | ICD-10-CM | POA: Diagnosis not present

## 2021-04-05 NOTE — Progress Notes (Signed)
    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 male who presents today for routine electrophysiology followup.  Since last being seen in our clinic, the patient reports doing very well.  He and his wife are caring for his 40 year old mother in law with dementia.  They find this taxing at times.  Today, he denies symptoms of palpitations, chest pain, shortness of breath,  lower extremity edema, dizziness, presyncope, or syncope.  The patient is otherwise without complaint today.   Past Medical History:  Diagnosis Date   Arthritis    GERD (gastroesophageal reflux disease)    Hyperlipidemia    Hypertension    Presence of permanent cardiac pacemaker    Prostate cancer ([place])    Syncope    recurrent, unexplained,  implantable loop recorder placed   Past Surgical History:  Procedure Laterality Date   INGUINAL HERNIA REPAIR Left    INGUINAL HERNIA REPAIR Right 03/03/2018   Procedure: HERNIA REPAIR INGUINAL ADULT WITH MESH;  Surgeon: Aviva Signs, MD;  Location: [facility];  Service: General;  Laterality: Right;   KNEE ARTHROSCOPY Left    LOOP RECORDER INSERTION N/A 03/16/2017   MDT LINQ implanted for recurrent unexplained syncope   LOOP RECORDER REMOVAL  12/22/2017   ILR removed by Dr Rayann Heman   PACEMAKER IMPLANT N/A 12/22/2017   MDT Jamse Mead XT DR MRI conditional PPM implanted by Dr Rayann Heman for syncope and bradycardia    ROS- all systems are reviewed and negative except as per HPI above  Current Outpatient Medications  Medication Sig Dispense Refill   amLODipine (NORVASC) 5 MG tablet Take 5 mg by mouth daily.     atorvastatin (LIPITOR) 20 MG tablet Take 20 mg by mouth every evening.      famotidine (PEPCID) 20 MG tablet Take 20 mg by mouth every evening.      omeprazole (PRILOSEC) 20 MG capsule Take 20 mg by mouth daily.     Saline (NASOGEL NA) Place 1 spray into the nose 2 (two) times daily. NeilMed Nasogel     triamcinolone (NASACORT) 55 MCG/ACT AERO  nasal inhaler Place into the nose.     No current facility-administered medications for this visit.    Physical Exam: Vitals:   04/05/21 1031  BP: 136/72  Pulse: 80  SpO2: 95%  Weight: 202 lb (91.6 kg)  Height: 5\' 11"  (1.803 m)     GEN- The patient is well appearing, alert and oriented x 3 today.   Head- normocephalic, atraumatic Eyes-  Sclera clear, conjunctiva pink Ears- hearing intact Oropharynx- clear Lungs- Clear to ausculation bilaterally, normal work of breathing Chest- pacemaker pocket is well healed Heart- Regular rate and rhythm, no murmurs, rubs or gallops, PMI not laterally displaced GI- soft, NT, ND, + BS Extremities- no clubbing, cyanosis, or edema  Pacemaker interrogation- reviewed in detail today,  See PACEART report  ekg tracing ordered today is personally reviewed  and shows sinus  Assessment and Plan:  1. Symptomatic sinus bradycardia  Normal pacemaker function See Pace Art report No changes today he is not device dependant today  2. HTN Stable No change required today  3. HL Continue statin  Return in a year  Thompson Grayer MD, [facility] 04/05/2021 10:57 AM

## 2021-04-05 NOTE — Patient Instructions (Signed)
Medication Instructions:  Continue all current medications.  Labwork: none  Testing/Procedures: none  Follow-Up: 1 year   Any Other Special Instructions Will Be Listed Below (If Applicable).  If you need a refill on your cardiac medications before your next appointment, please call your pharmacy.  

## 2021-04-08 ENCOUNTER — Ambulatory Visit (INDEPENDENT_AMBULATORY_CARE_PROVIDER_SITE_OTHER): Payer: Medicare Other

## 2021-04-08 DIAGNOSIS — I495 Sick sinus syndrome: Secondary | ICD-10-CM

## 2021-04-09 LAB — CUP PACEART REMOTE DEVICE CHECK
Battery Remaining Longevity: 145 mo
Battery Voltage: 3.03 V
Brady Statistic AP VP Percent: 0 %
Brady Statistic AP VS Percent: 0.83 %
Brady Statistic AS VP Percent: 0.05 %
Brady Statistic AS VS Percent: 99.12 %
Brady Statistic RA Percent Paced: 0.93 %
Brady Statistic RV Percent Paced: 0.05 %
Date Time Interrogation Session: 20220613014416
Implantable Lead Implant Date: 20190226
Implantable Lead Implant Date: 20190226
Implantable Lead Location: 753859
Implantable Lead Location: 753860
Implantable Lead Model: 5076
Implantable Lead Model: 5076
Implantable Pulse Generator Implant Date: 20190226
Lead Channel Impedance Value: 323 Ohm
Lead Channel Impedance Value: 380 Ohm
Lead Channel Impedance Value: 418 Ohm
Lead Channel Impedance Value: 513 Ohm
Lead Channel Pacing Threshold Amplitude: 0.5 V
Lead Channel Pacing Threshold Amplitude: 0.625 V
Lead Channel Pacing Threshold Pulse Width: 0.4 ms
Lead Channel Pacing Threshold Pulse Width: 0.4 ms
Lead Channel Sensing Intrinsic Amplitude: 13.5 mV
Lead Channel Sensing Intrinsic Amplitude: 13.5 mV
Lead Channel Sensing Intrinsic Amplitude: 2.375 mV
Lead Channel Sensing Intrinsic Amplitude: 2.375 mV
Lead Channel Setting Pacing Amplitude: 1.5 V
Lead Channel Setting Pacing Amplitude: 2.5 V
Lead Channel Setting Pacing Pulse Width: 0.4 ms
Lead Channel Setting Sensing Sensitivity: 1.2 mV

## 2021-04-30 NOTE — Progress Notes (Signed)
Remote pacemaker transmission.   

## 2021-07-08 ENCOUNTER — Ambulatory Visit (INDEPENDENT_AMBULATORY_CARE_PROVIDER_SITE_OTHER): Payer: Medicare Other

## 2021-07-08 DIAGNOSIS — I495 Sick sinus syndrome: Secondary | ICD-10-CM

## 2021-07-08 LAB — CUP PACEART REMOTE DEVICE CHECK
Battery Remaining Longevity: 142 mo
Battery Voltage: 3.03 V
Brady Statistic AP VP Percent: 0 %
Brady Statistic AP VS Percent: 2.09 %
Brady Statistic AS VP Percent: 0.04 %
Brady Statistic AS VS Percent: 97.86 %
Brady Statistic RA Percent Paced: 2.34 %
Brady Statistic RV Percent Paced: 0.05 %
Date Time Interrogation Session: 20220912020449
Implantable Lead Implant Date: 20190226
Implantable Lead Implant Date: 20190226
Implantable Lead Location: 753859
Implantable Lead Location: 753860
Implantable Lead Model: 5076
Implantable Lead Model: 5076
Implantable Pulse Generator Implant Date: 20190226
Lead Channel Impedance Value: 342 Ohm
Lead Channel Impedance Value: 399 Ohm
Lead Channel Impedance Value: 456 Ohm
Lead Channel Impedance Value: 589 Ohm
Lead Channel Pacing Threshold Amplitude: 0.5 V
Lead Channel Pacing Threshold Amplitude: 0.625 V
Lead Channel Pacing Threshold Pulse Width: 0.4 ms
Lead Channel Pacing Threshold Pulse Width: 0.4 ms
Lead Channel Sensing Intrinsic Amplitude: 12.5 mV
Lead Channel Sensing Intrinsic Amplitude: 12.5 mV
Lead Channel Sensing Intrinsic Amplitude: 2.75 mV
Lead Channel Sensing Intrinsic Amplitude: 2.75 mV
Lead Channel Setting Pacing Amplitude: 1.5 V
Lead Channel Setting Pacing Amplitude: 2.5 V
Lead Channel Setting Pacing Pulse Width: 0.4 ms
Lead Channel Setting Sensing Sensitivity: 1.2 mV

## 2021-07-16 NOTE — Progress Notes (Signed)
Remote pacemaker transmission.   

## 2021-10-01 ENCOUNTER — Other Ambulatory Visit: Payer: Self-pay

## 2021-10-01 ENCOUNTER — Telehealth: Payer: Self-pay

## 2021-10-01 ENCOUNTER — Other Ambulatory Visit: Payer: Medicare Other

## 2021-10-01 DIAGNOSIS — C61 Malignant neoplasm of prostate: Secondary | ICD-10-CM

## 2021-10-02 LAB — PSA: Prostate Specific Ag, Serum: 9.8 ng/mL — ABNORMAL HIGH (ref 0.0–4.0)

## 2021-10-07 ENCOUNTER — Ambulatory Visit (INDEPENDENT_AMBULATORY_CARE_PROVIDER_SITE_OTHER): Payer: Medicare Other

## 2021-10-07 DIAGNOSIS — Z950 Presence of cardiac pacemaker: Secondary | ICD-10-CM | POA: Diagnosis not present

## 2021-10-07 DIAGNOSIS — I495 Sick sinus syndrome: Secondary | ICD-10-CM | POA: Diagnosis not present

## 2021-10-07 LAB — CUP PACEART REMOTE DEVICE CHECK
Battery Remaining Longevity: 139 mo
Battery Voltage: 3.02 V
Brady Statistic AP VP Percent: 0 %
Brady Statistic AP VS Percent: 0.77 %
Brady Statistic AS VP Percent: 0.04 %
Brady Statistic AS VS Percent: 99.18 %
Brady Statistic RA Percent Paced: 0.91 %
Brady Statistic RV Percent Paced: 0.04 %
Date Time Interrogation Session: 20221212004228
Implantable Lead Implant Date: 20190226
Implantable Lead Implant Date: 20190226
Implantable Lead Location: 753859
Implantable Lead Location: 753860
Implantable Lead Model: 5076
Implantable Lead Model: 5076
Implantable Pulse Generator Implant Date: 20190226
Lead Channel Impedance Value: 323 Ohm
Lead Channel Impedance Value: 380 Ohm
Lead Channel Impedance Value: 418 Ohm
Lead Channel Impedance Value: 551 Ohm
Lead Channel Pacing Threshold Amplitude: 0.5 V
Lead Channel Pacing Threshold Amplitude: 0.625 V
Lead Channel Pacing Threshold Pulse Width: 0.4 ms
Lead Channel Pacing Threshold Pulse Width: 0.4 ms
Lead Channel Sensing Intrinsic Amplitude: 14.375 mV
Lead Channel Sensing Intrinsic Amplitude: 14.375 mV
Lead Channel Sensing Intrinsic Amplitude: 2.25 mV
Lead Channel Sensing Intrinsic Amplitude: 2.25 mV
Lead Channel Setting Pacing Amplitude: 1.5 V
Lead Channel Setting Pacing Amplitude: 2.5 V
Lead Channel Setting Pacing Pulse Width: 0.4 ms
Lead Channel Setting Sensing Sensitivity: 1.2 mV

## 2021-10-08 ENCOUNTER — Other Ambulatory Visit: Payer: Self-pay

## 2021-10-08 ENCOUNTER — Encounter: Payer: Self-pay | Admitting: Urology

## 2021-10-08 ENCOUNTER — Ambulatory Visit (INDEPENDENT_AMBULATORY_CARE_PROVIDER_SITE_OTHER): Payer: Medicare Other | Admitting: Urology

## 2021-10-08 VITALS — BP 144/89 | HR 77

## 2021-10-08 DIAGNOSIS — N400 Enlarged prostate without lower urinary tract symptoms: Secondary | ICD-10-CM | POA: Diagnosis not present

## 2021-10-08 DIAGNOSIS — C61 Malignant neoplasm of prostate: Secondary | ICD-10-CM

## 2021-10-08 LAB — URINALYSIS, ROUTINE W REFLEX MICROSCOPIC
Bilirubin, UA: NEGATIVE
Glucose, UA: NEGATIVE
Ketones, UA: NEGATIVE
Leukocytes,UA: NEGATIVE
Nitrite, UA: NEGATIVE
Protein,UA: NEGATIVE
RBC, UA: NEGATIVE
Specific Gravity, UA: 1.025 (ref 1.005–1.030)
Urobilinogen, Ur: 0.2 mg/dL (ref 0.2–1.0)
pH, UA: 5.5 (ref 5.0–7.5)

## 2021-10-08 NOTE — Progress Notes (Signed)
History of Present Illness:   Prior to his presentation here in 2014, he had had 2 prior TRUS/Bxies by Dr. Maryland Pink, in 2004 in 2009. All cores were negative by report. His PSAs ran between 7 and 9. AAt 1st visit PCA 3 test was 40/positive. Repeat TRUS/Bx 3.18.2014. 2/12 cores were positive for adenocarcinoma, both cores only had 5% involvement with GS 3+3 adenocarcinoma. Both positive cores were  left base lateral and left mid lateral. There was atypia found at the right mid medial biopsy. Prostatic volume was 42 cc. He elected AS. Prior to enrolling him in active surveillance strategy, he underwent endorectal coil MRI. This revealed findings consistent with adenocarcinoma the left mid and lateral base. There was no evidence of extracapsular abnormality. There is no evidence of right-sided or apical disease.  He underwent surveillance TRUS/Bx on 4.14.2015. 1/12 cores showed adenocarcinoma of the prostate-Gleason 3+3, 10% of that core, at the left base lateral, was positive. The others were all negative.  Oncotype DX/GPS result--32 consistent with 68% chance of favorable pathology.  81% likelihood of low-grade disease  76% chance of organ confined disease    7.12.2019: MRI prostate. Volume 87 ml. No evidence of transcapsular spread/SV involvement/NV bundle involvement/pelvic adenopathy/bony mets. PIRADS 2-3 lesion medial left base   8.19.2019:  fusion Bx. Prostate volume 89.38 ml. All cores taken, including ROI cores were negative for PCa.  2.25.2020: PSA 8.6 9.4.2020: PSA 10.0 2.16.2021: PSA 14.3 5.28.2021: 10.1 12.6.2022: PSA 9.8.  No real problems with urination.  He is not on any medical therapy for BPH.  He has had no gross hematuria, no infection since his last visit. Past Medical History:  Diagnosis Date   Arthritis    GERD (gastroesophageal reflux disease)    Hyperlipidemia    Hypertension    Presence of permanent cardiac pacemaker    Prostate cancer ([place])    Syncope    recurrent,  unexplained,  implantable loop recorder placed    Past Surgical History:  Procedure Laterality Date   INGUINAL HERNIA REPAIR Left    INGUINAL HERNIA REPAIR Right 03/03/2018   Procedure: HERNIA REPAIR INGUINAL ADULT WITH MESH;  Surgeon: Aviva Signs, MD;  Location: [facility];  Service: General;  Laterality: Right;   KNEE ARTHROSCOPY Left    LOOP RECORDER INSERTION N/A 03/16/2017   MDT LINQ implanted for recurrent unexplained syncope   LOOP RECORDER REMOVAL  12/22/2017   ILR removed by Dr Rayann Heman   PACEMAKER IMPLANT N/A 12/22/2017   MDT Jamse Mead XT DR MRI conditional PPM implanted by Dr Rayann Heman for syncope and bradycardia    Home Medications:  Allergies as of 10/08/2021       Reactions   Other Other (See Comments)   "pain medication thinks its hydrocodone - made me pass out"   Oxycodone Other (See Comments)   Eyes rolled back in head        Medication List        Accurate as of October 08, 2021  8:30 AM. If you have any questions, ask your nurse or doctor.          amLODipine 5 MG tablet Commonly known as: NORVASC Take 5 mg by mouth daily.   atorvastatin 20 MG tablet Commonly known as: LIPITOR Take 20 mg by mouth every evening.   famotidine 20 MG tablet Commonly known as: PEPCID Take 20 mg by mouth every evening.   NASOGEL NA Place 1 spray into the nose 2 (two) times daily. NeilMed Nasogel   omeprazole  20 MG capsule Commonly known as: PRILOSEC Take 20 mg by mouth daily.   triamcinolone 55 MCG/ACT Aero nasal inhaler Commonly known as: NASACORT Place into the nose.        Allergies:  Allergies  Allergen Reactions   Other Other (See Comments)    "pain medication thinks its hydrocodone - made me pass out"   Oxycodone Other (See Comments)    Eyes rolled back in head    Family History  Problem Relation Age of Onset   Aneurysm Mother    Heart failure Father     Social History:  reports that he has never smoked. He has never used smokeless tobacco. He  reports that he does not drink alcohol and does not use drugs.  ROS: A complete review of systems was performed.  All systems are negative except for pertinent findings as noted.  Physical Exam:  Vital signs in last 24 hours: There were no vitals taken for this visit. Constitutional:  Alert and oriented, No acute distress Cardiovascular: Regular rate  Respiratory: Normal respiratory effort GI: Abdomen is soft, nontender, nondistended, no abdominal masses. No CVAT.  Genitourinary: Normal male phallus, testes are descended bilaterally and non-tender and without masses, scrotum is normal in appearance without lesions or masses, perineum is normal on inspection.  He has normal anal sphincter tone.  There are no rectal masses.  Prostate 100 g, symmetric, nonnodular, nontender. Lymphatic: No lymphadenopathy Neurologic: Grossly intact, no focal deficits Psychiatric: Normal mood and affect  I have reviewed prior pt notes  I have reviewed notes from referring/previous physicians  I have reviewed urinalysis results  I have independently reviewed prior imaging-prostate ultrasound volume  I have reviewed prior PSA results    Impression/Assessment:  1.  Prostate cancer, very low risk, low-volume, last biopsy negative in 2019.  At that time prostate volume 90 mL.  PSA is stable as his DRE  2.  BPH, large gland with minimal symptomatology  Plan:  1.  I will see back in 6 months, following PSA  2.  If stable PSA pattern, consider next MRI/biopsy in 2024

## 2021-10-08 NOTE — Progress Notes (Signed)

## 2021-10-16 NOTE — Progress Notes (Signed)
Remote pacemaker transmission.   

## 2022-01-06 ENCOUNTER — Ambulatory Visit (INDEPENDENT_AMBULATORY_CARE_PROVIDER_SITE_OTHER): Payer: Medicare Other

## 2022-01-06 DIAGNOSIS — I495 Sick sinus syndrome: Secondary | ICD-10-CM | POA: Diagnosis not present

## 2022-01-06 LAB — CUP PACEART REMOTE DEVICE CHECK
Battery Remaining Longevity: 136 mo
Battery Voltage: 3.02 V
Brady Statistic AP VP Percent: 0 %
Brady Statistic AP VS Percent: 0.94 %
Brady Statistic AS VP Percent: 0.04 %
Brady Statistic AS VS Percent: 99.02 %
Brady Statistic RA Percent Paced: 1 %
Brady Statistic RV Percent Paced: 0.04 %
Date Time Interrogation Session: 20230313014441
Implantable Lead Implant Date: 20190226
Implantable Lead Implant Date: 20190226
Implantable Lead Location: 753859
Implantable Lead Location: 753860
Implantable Lead Model: 5076
Implantable Lead Model: 5076
Implantable Pulse Generator Implant Date: 20190226
Lead Channel Impedance Value: 323 Ohm
Lead Channel Impedance Value: 380 Ohm
Lead Channel Impedance Value: 418 Ohm
Lead Channel Impedance Value: 551 Ohm
Lead Channel Pacing Threshold Amplitude: 0.5 V
Lead Channel Pacing Threshold Amplitude: 0.625 V
Lead Channel Pacing Threshold Pulse Width: 0.4 ms
Lead Channel Pacing Threshold Pulse Width: 0.4 ms
Lead Channel Sensing Intrinsic Amplitude: 12.875 mV
Lead Channel Sensing Intrinsic Amplitude: 12.875 mV
Lead Channel Sensing Intrinsic Amplitude: 2.625 mV
Lead Channel Sensing Intrinsic Amplitude: 2.625 mV
Lead Channel Setting Pacing Amplitude: 1.5 V
Lead Channel Setting Pacing Amplitude: 2.5 V
Lead Channel Setting Pacing Pulse Width: 0.4 ms
Lead Channel Setting Sensing Sensitivity: 1.2 mV

## 2022-01-17 NOTE — Progress Notes (Signed)
Remote pacemaker transmission.   

## 2022-03-26 ENCOUNTER — Other Ambulatory Visit: Payer: Medicare Other

## 2022-03-26 DIAGNOSIS — C61 Malignant neoplasm of prostate: Secondary | ICD-10-CM

## 2022-03-27 LAB — PSA: Prostate Specific Ag, Serum: 13.3 ng/mL — ABNORMAL HIGH (ref 0.0–4.0)

## 2022-03-28 ENCOUNTER — Encounter: Payer: Medicare Other | Admitting: Internal Medicine

## 2022-03-31 NOTE — Progress Notes (Signed)
History of Present Illness:   Prior to 1st visit in 2014, he had had 2 prior TRUS/Bxies by Dr. [place] Pink, in 2004 in 2009. A  ll cores were negative by report. His PSAs ran between 7 and 9. At 1st visit PCA 3 test was 40/positive. Repeat TRUS/Bx 3.18.2014. 2/12 cores were positive for adenocarcinoma, both cores only had 5% involvement with GS 3+3 adenocarcinoma. Positive cores were  left base lateral and left mid lateral. Atypia found at the right mid medial biopsy. Prostatic volume was 42 cc. He elected AS.  Prior to enrolling him in active surveillance strategy, he underwent endorectal coil MRI. This revealed findings consistent with adenocarcinoma the left mid and lateral base. There was no evidence of extracapsular abnormality. There is no evidence of right-sided or apical disease.   4.14.2015: He underwent surveillance TRUS/Bx.1/12 cores showed adenocarcinoma of the prostate-GS 3+3, 10% of that core, at the left base lateral, was positive.  Oncotype DX/GPS result--32 consistent with 68% chance of favorable pathology.  81% likelihood of low-grade disease  76% chance of organ confined disease    7.12.2019: MRI prostate. Volume 87 ml. No evidence of transcapsular spread/SV involvement/NV bundle involvement/pelvic adenopathy/bony mets. PIRADS 2-3 lesion medial left base   8.19.2019:  fusion Bx. Prostate volume 89.38 ml. All cores taken, including ROI cores were negative for PCa.  2.25.2020: PSA 8.6 9.4.2020: PSA 10.0 2.16.2021: PSA 14.3 5.28.2021: 10.1 12.6.2022: PSA 9.8.   6.6.2023: PSA 13.3.  He is not complaining of any lower urinary tract symptoms.  IPSS 4, quality-of-life score 1.  Not on any BPH medications. Past Medical History:  Diagnosis Date   Arthritis    GERD (gastroesophageal reflux disease)    Hyperlipidemia    Hypertension    Presence of permanent cardiac pacemaker    Prostate cancer ([place])    Syncope    recurrent, unexplained,  implantable loop recorder placed     Past Surgical History:  Procedure Laterality Date   INGUINAL HERNIA REPAIR Left    INGUINAL HERNIA REPAIR Right 03/03/2018   Procedure: HERNIA REPAIR INGUINAL ADULT WITH MESH;  Surgeon: Aviva Signs, MD;  Location: [facility];  Service: General;  Laterality: Right;   KNEE ARTHROSCOPY Left    LOOP RECORDER INSERTION N/A 03/16/2017   MDT LINQ implanted for recurrent unexplained syncope   LOOP RECORDER REMOVAL  12/22/2017   ILR removed by Dr Rayann Heman   PACEMAKER IMPLANT N/A 12/22/2017   MDT Jamse Mead XT DR MRI conditional PPM implanted by Dr Rayann Heman for syncope and bradycardia    Home Medications:  Allergies as of 04/01/2022       Reactions   Metoprolol    bradycardia   Other Other (See Comments)   "pain medication thinks its hydrocodone - made me pass out"   Oxycodone Other (See Comments)   Eyes rolled back in head        Medication List        Accurate as of March 31, 2022  8:46 PM. If you have any questions, ask your nurse or doctor.          amLODipine 5 MG tablet Commonly known as: NORVASC Take 5 mg by mouth daily.   atorvastatin 20 MG tablet Commonly known as: LIPITOR Take 20 mg by mouth every evening.   famotidine 20 MG tablet Commonly known as: PEPCID Take 20 mg by mouth every evening.   NASOGEL NA Place 1 spray into the nose 2 (two) times daily. NeilMed Nasogel   omeprazole 20  MG capsule Commonly known as: PRILOSEC Take 20 mg by mouth daily.        Allergies:  Allergies  Allergen Reactions   Metoprolol     bradycardia   Other Other (See Comments)    "pain medication thinks its hydrocodone - made me pass out"   Oxycodone Other (See Comments)    Eyes rolled back in head    Family History  Problem Relation Age of Onset   Aneurysm Mother    Heart failure Father     Social History:  reports that he has never smoked. He has never used smokeless tobacco. He reports that he does not drink alcohol and does not use drugs.  ROS: A complete review of  systems was performed.  All systems are negative except for pertinent findings as noted.  Physical Exam:  Vital signs in last 24 hours: There were no vitals taken for this visit. Constitutional:  Alert and oriented, No acute distress Cardiovascular: Regular rate  Respiratory: Normal respiratory effort Genitourinary: Normal male phallus, testes are descended bilaterally and non-tender and without masses, scrotum is normal in appearance without lesions or masses, perineum is normal on inspection.  Prostate 80 g, symmetrical, nonnodular tender. Neurologic: Grossly intact, no focal deficits Psychiatric: Normal mood and affect  I have reviewed prior pt notes  I have reviewed urinalysis results  I have independently reviewed prior imaging--ultrasound volume  I have reviewed prior PSA results  Reviewed IPSS form   Impression/Assessment:  1.  Grade group 1/very low volume prostate cancer, on active surveillance.  Last biopsies in 2019 were all negative.  2.  BPH, large prostate with minimal symptomatology, stable exam  Plan:  1.  I think it is fine to just have him drop in for PSA in 6 months  2.  I will see him back in 1 year following PSA

## 2022-04-01 ENCOUNTER — Ambulatory Visit (INDEPENDENT_AMBULATORY_CARE_PROVIDER_SITE_OTHER): Payer: Medicare Other | Admitting: Urology

## 2022-04-01 VITALS — BP 130/71 | HR 74

## 2022-04-01 DIAGNOSIS — Z8546 Personal history of malignant neoplasm of prostate: Secondary | ICD-10-CM | POA: Diagnosis not present

## 2022-04-01 DIAGNOSIS — N400 Enlarged prostate without lower urinary tract symptoms: Secondary | ICD-10-CM

## 2022-04-01 DIAGNOSIS — C61 Malignant neoplasm of prostate: Secondary | ICD-10-CM

## 2022-04-01 LAB — URINALYSIS, ROUTINE W REFLEX MICROSCOPIC
Bilirubin, UA: NEGATIVE
Glucose, UA: NEGATIVE
Leukocytes,UA: NEGATIVE
Nitrite, UA: NEGATIVE
Protein,UA: NEGATIVE
RBC, UA: NEGATIVE
Specific Gravity, UA: 1.02 (ref 1.005–1.030)
Urobilinogen, Ur: 0.2 mg/dL (ref 0.2–1.0)
pH, UA: 5.5 (ref 5.0–7.5)

## 2022-04-04 ENCOUNTER — Ambulatory Visit (INDEPENDENT_AMBULATORY_CARE_PROVIDER_SITE_OTHER): Payer: Medicare Other | Admitting: Internal Medicine

## 2022-04-04 ENCOUNTER — Encounter: Payer: Self-pay | Admitting: Internal Medicine

## 2022-04-04 VITALS — BP 137/83 | HR 59 | Ht 71.0 in | Wt 194.0 lb

## 2022-04-04 DIAGNOSIS — R001 Bradycardia, unspecified: Secondary | ICD-10-CM

## 2022-04-04 DIAGNOSIS — E785 Hyperlipidemia, unspecified: Secondary | ICD-10-CM | POA: Diagnosis not present

## 2022-04-04 DIAGNOSIS — I10 Essential (primary) hypertension: Secondary | ICD-10-CM

## 2022-04-04 NOTE — Progress Notes (Signed)
    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  Since last being seen in our clinic, the patient reports doing very well.  Today, he denies symptoms of palpitations, chest pain, shortness of breath,  lower extremity edema, dizziness, presyncope, or syncope.  The patient is otherwise without complaint today.   Past Medical History:  Diagnosis Date   Arthritis    GERD (gastroesophageal reflux disease)    Hyperlipidemia    Hypertension    Presence of permanent cardiac pacemaker    Prostate cancer ([place])    Syncope    recurrent, unexplained,  implantable loop recorder placed   Past Surgical History:  Procedure Laterality Date   INGUINAL HERNIA REPAIR Left    INGUINAL HERNIA REPAIR Right 03/03/2018   Procedure: HERNIA REPAIR INGUINAL ADULT WITH MESH;  Surgeon: Aviva Signs, MD;  Location: [facility];  Service: General;  Laterality: Right;   KNEE ARTHROSCOPY Left    LOOP RECORDER INSERTION N/A 03/16/2017   MDT LINQ implanted for recurrent unexplained syncope   LOOP RECORDER REMOVAL  12/22/2017   ILR removed by Dr Rayann Heman   PACEMAKER IMPLANT N/A 12/22/2017   MDT Jamse Mead XT DR MRI conditional PPM implanted by Dr Rayann Heman for syncope and bradycardia    ROS- all systems are reviewed and negative except as per HPI above  Current Outpatient Medications  Medication Sig Dispense Refill   amLODipine (NORVASC) 5 MG tablet Take 5 mg by mouth daily.     atorvastatin (LIPITOR) 20 MG tablet Take 20 mg by mouth every evening.      famotidine (PEPCID) 20 MG tablet Take 20 mg by mouth every evening.      omeprazole (PRILOSEC) 20 MG capsule Take 20 mg by mouth daily.     Saline (NASOGEL NA) Place 1 spray into the nose 2 (two) times daily. NeilMed Nasogel     No current facility-administered medications for this visit.    Physical Exam: Vitals:   04/04/22 1112  BP: 137/83  Pulse: (!) 59  SpO2: 96%   Weight: 194 lb (88 kg)  Height: '5\' 11"'$  (1.803 m)    GEN- The patient is well appearing, alert and oriented x 3 today.   Head- normocephalic, atraumatic Eyes-  Sclera clear, conjunctiva pink Ears- hearing intact Oropharynx- clear Lungs- Clear to ausculation bilaterally, normal work of breathing Chest- pacemaker pocket is well healed Heart- Regular rate and rhythm, no murmurs, rubs or gallops, PMI not laterally displaced GI- soft, NT, ND, + BS Extremities- no clubbing, cyanosis, or edema  Pacemaker interrogation- reviewed in detail today,  See PACEART report  ekg tracing ordered today is personally reviewed and shows sinus  Assessment and Plan:  1. Symptomatic sinus bradycardia  Normal pacemaker function See Pace Art report No changes today he is not device dependant today  2. HTN Stable No change required today  Return in a year  Thompson Grayer MD, [facility] 04/04/2022 11:41 AM

## 2022-04-04 NOTE — Patient Instructions (Signed)
Medication Instructions:  Your physician recommends that you continue on your current medications as directed. Please refer to the Current Medication list given to you today.  *If you need a refill on your cardiac medications before your next appointment, please call your pharmacy*   Lab Work: None ordered.  If you have labs (blood work) drawn today and your tests are completely normal, you will receive your results only by: [place] (if you have MyChart) OR A paper copy in the mail If you have any lab test that is abnormal or we need to change your treatment, we will call you to review the results.   Testing/Procedures: None ordered.    Follow-Up: At [facility], you and your health needs are our priority.  As part of our continuing mission to provide you with exceptional heart care, we have created designated Provider Care Teams.  These Care Teams include your primary Cardiologist (physician) and Advanced Practice Providers (APPs -  Physician Assistants and Nurse Practitioners) who all work together to provide you with the care you need, when you need it.  We recommend signing up for the patient portal called "MyChart".  Sign up information is provided on this After Visit Summary.  MyChart is used to connect with patients for Virtual Visits (Telemedicine).  Patients are able to view lab/test results, encounter notes, upcoming appointments, etc.  Non-urgent messages can be sent to your provider as well.   To learn more about what you can do with MyChart, go to [email or web address].    Your next appointment:   12 months with Dr Rayann Heman  Important Information About Sugar

## 2022-05-09 ENCOUNTER — Encounter: Payer: Self-pay | Admitting: Internal Medicine

## 2022-05-19 ENCOUNTER — Ambulatory Visit (INDEPENDENT_AMBULATORY_CARE_PROVIDER_SITE_OTHER): Payer: Medicare Other

## 2022-05-19 DIAGNOSIS — I495 Sick sinus syndrome: Secondary | ICD-10-CM

## 2022-05-20 LAB — CUP PACEART REMOTE DEVICE CHECK
Battery Remaining Longevity: 132 mo
Battery Voltage: 3.02 V
Brady Statistic AP VP Percent: 0 %
Brady Statistic AP VS Percent: 2.13 %
Brady Statistic AS VP Percent: 0.04 %
Brady Statistic AS VS Percent: 97.83 %
Brady Statistic RA Percent Paced: 2.15 %
Brady Statistic RV Percent Paced: 0.04 %
Date Time Interrogation Session: 20230722121609
Implantable Lead Implant Date: 20190226
Implantable Lead Implant Date: 20190226
Implantable Lead Location: 753859
Implantable Lead Location: 753860
Implantable Lead Model: 5076
Implantable Lead Model: 5076
Implantable Pulse Generator Implant Date: 20190226
Lead Channel Impedance Value: 323 Ohm
Lead Channel Impedance Value: 380 Ohm
Lead Channel Impedance Value: 399 Ohm
Lead Channel Impedance Value: 475 Ohm
Lead Channel Pacing Threshold Amplitude: 0.5 V
Lead Channel Pacing Threshold Amplitude: 0.625 V
Lead Channel Pacing Threshold Pulse Width: 0.4 ms
Lead Channel Pacing Threshold Pulse Width: 0.4 ms
Lead Channel Sensing Intrinsic Amplitude: 12.25 mV
Lead Channel Sensing Intrinsic Amplitude: 12.25 mV
Lead Channel Sensing Intrinsic Amplitude: 2.5 mV
Lead Channel Sensing Intrinsic Amplitude: 2.5 mV
Lead Channel Setting Pacing Amplitude: 1.5 V
Lead Channel Setting Pacing Amplitude: 2.5 V
Lead Channel Setting Pacing Pulse Width: 0.4 ms
Lead Channel Setting Sensing Sensitivity: 1.2 mV

## 2022-06-19 NOTE — Progress Notes (Signed)
Remote pacemaker transmission.   

## 2022-08-18 ENCOUNTER — Ambulatory Visit: Payer: Medicare Other | Attending: Cardiovascular Disease

## 2022-08-18 DIAGNOSIS — Z95810 Presence of automatic (implantable) cardiac defibrillator: Secondary | ICD-10-CM | POA: Diagnosis not present

## 2022-08-18 DIAGNOSIS — I495 Sick sinus syndrome: Secondary | ICD-10-CM

## 2022-08-19 LAB — CUP PACEART REMOTE DEVICE CHECK
Battery Remaining Longevity: 129 mo
Battery Voltage: 3.02 V
Brady Statistic AP VP Percent: 0.01 %
Brady Statistic AP VS Percent: 4.23 %
Brady Statistic AS VP Percent: 0.04 %
Brady Statistic AS VS Percent: 95.73 %
Brady Statistic RA Percent Paced: 4.36 %
Brady Statistic RV Percent Paced: 0.05 %
Date Time Interrogation Session: 20231023014415
Implantable Lead Connection Status: 753985
Implantable Lead Connection Status: 753985
Implantable Lead Implant Date: 20190226
Implantable Lead Implant Date: 20190226
Implantable Lead Location: 753859
Implantable Lead Location: 753860
Implantable Lead Model: 5076
Implantable Lead Model: 5076
Implantable Pulse Generator Implant Date: 20190226
Lead Channel Impedance Value: 304 Ohm
Lead Channel Impedance Value: 380 Ohm
Lead Channel Impedance Value: 418 Ohm
Lead Channel Impedance Value: 513 Ohm
Lead Channel Pacing Threshold Amplitude: 0.5 V
Lead Channel Pacing Threshold Amplitude: 0.625 V
Lead Channel Pacing Threshold Pulse Width: 0.4 ms
Lead Channel Pacing Threshold Pulse Width: 0.4 ms
Lead Channel Sensing Intrinsic Amplitude: 12 mV
Lead Channel Sensing Intrinsic Amplitude: 12 mV
Lead Channel Sensing Intrinsic Amplitude: 2.375 mV
Lead Channel Sensing Intrinsic Amplitude: 2.375 mV
Lead Channel Setting Pacing Amplitude: 1.5 V
Lead Channel Setting Pacing Amplitude: 2.5 V
Lead Channel Setting Pacing Pulse Width: 0.4 ms
Lead Channel Setting Sensing Sensitivity: 1.2 mV
Zone Setting Status: 755011
Zone Setting Status: 755011

## 2022-09-10 NOTE — Progress Notes (Signed)
Remote pacemaker transmission.   

## 2022-10-01 ENCOUNTER — Other Ambulatory Visit: Payer: Medicare Other

## 2022-10-01 DIAGNOSIS — C61 Malignant neoplasm of prostate: Secondary | ICD-10-CM

## 2022-10-02 LAB — PSA: Prostate Specific Ag, Serum: 9.1 ng/mL — ABNORMAL HIGH (ref 0.0–4.0)

## 2022-11-17 ENCOUNTER — Ambulatory Visit: Payer: Medicare Other | Attending: Cardiovascular Disease

## 2022-11-17 DIAGNOSIS — I495 Sick sinus syndrome: Secondary | ICD-10-CM | POA: Diagnosis not present

## 2022-11-18 LAB — CUP PACEART REMOTE DEVICE CHECK
Battery Remaining Longevity: 126 mo
Battery Voltage: 3.02 V
Brady Statistic AP VP Percent: 0 %
Brady Statistic AP VS Percent: 2.82 %
Brady Statistic AS VP Percent: 0.04 %
Brady Statistic AS VS Percent: 97.13 %
Brady Statistic RA Percent Paced: 3.3 %
Brady Statistic RV Percent Paced: 0.04 %
Date Time Interrogation Session: 20240122005539
Implantable Lead Connection Status: 753985
Implantable Lead Connection Status: 753985
Implantable Lead Implant Date: 20190226
Implantable Lead Implant Date: 20190226
Implantable Lead Location: 753859
Implantable Lead Location: 753860
Implantable Lead Model: 5076
Implantable Lead Model: 5076
Implantable Pulse Generator Implant Date: 20190226
Lead Channel Impedance Value: 323 Ohm
Lead Channel Impedance Value: 380 Ohm
Lead Channel Impedance Value: 399 Ohm
Lead Channel Impedance Value: 475 Ohm
Lead Channel Pacing Threshold Amplitude: 0.625 V
Lead Channel Pacing Threshold Amplitude: 0.625 V
Lead Channel Pacing Threshold Pulse Width: 0.4 ms
Lead Channel Pacing Threshold Pulse Width: 0.4 ms
Lead Channel Sensing Intrinsic Amplitude: 12.5 mV
Lead Channel Sensing Intrinsic Amplitude: 12.5 mV
Lead Channel Sensing Intrinsic Amplitude: 2.625 mV
Lead Channel Sensing Intrinsic Amplitude: 2.625 mV
Lead Channel Setting Pacing Amplitude: 1.5 V
Lead Channel Setting Pacing Amplitude: 2.5 V
Lead Channel Setting Pacing Pulse Width: 0.4 ms
Lead Channel Setting Sensing Sensitivity: 1.2 mV
Zone Setting Status: 755011
Zone Setting Status: 755011

## 2023-01-02 NOTE — Progress Notes (Signed)
Remote pacemaker transmission.   

## 2023-02-16 ENCOUNTER — Ambulatory Visit (INDEPENDENT_AMBULATORY_CARE_PROVIDER_SITE_OTHER): Payer: Medicare Other

## 2023-02-16 DIAGNOSIS — I495 Sick sinus syndrome: Secondary | ICD-10-CM

## 2023-02-17 LAB — CUP PACEART REMOTE DEVICE CHECK
Battery Remaining Longevity: 123 mo
Battery Voltage: 3.01 V
Brady Statistic AP VP Percent: 0 %
Brady Statistic AP VS Percent: 2.21 %
Brady Statistic AS VP Percent: 0.04 %
Brady Statistic AS VS Percent: 97.75 %
Brady Statistic RA Percent Paced: 2.68 %
Brady Statistic RV Percent Paced: 0.04 %
Date Time Interrogation Session: 20240422014523
Implantable Lead Connection Status: 753985
Implantable Lead Connection Status: 753985
Implantable Lead Implant Date: 20190226
Implantable Lead Implant Date: 20190226
Implantable Lead Location: 753859
Implantable Lead Location: 753860
Implantable Lead Model: 5076
Implantable Lead Model: 5076
Implantable Pulse Generator Implant Date: 20190226
Lead Channel Impedance Value: 342 Ohm
Lead Channel Impedance Value: 380 Ohm
Lead Channel Impedance Value: 399 Ohm
Lead Channel Impedance Value: 475 Ohm
Lead Channel Pacing Threshold Amplitude: 0.5 V
Lead Channel Pacing Threshold Amplitude: 0.625 V
Lead Channel Pacing Threshold Pulse Width: 0.4 ms
Lead Channel Pacing Threshold Pulse Width: 0.4 ms
Lead Channel Sensing Intrinsic Amplitude: 12.125 mV
Lead Channel Sensing Intrinsic Amplitude: 12.125 mV
Lead Channel Sensing Intrinsic Amplitude: 2.625 mV
Lead Channel Sensing Intrinsic Amplitude: 2.625 mV
Lead Channel Setting Pacing Amplitude: 1.5 V
Lead Channel Setting Pacing Amplitude: 2.5 V
Lead Channel Setting Pacing Pulse Width: 0.4 ms
Lead Channel Setting Sensing Sensitivity: 1.2 mV
Zone Setting Status: 755011
Zone Setting Status: 755011

## 2023-03-24 NOTE — Progress Notes (Signed)
Remote pacemaker transmission.   

## 2023-03-31 ENCOUNTER — Other Ambulatory Visit: Payer: Medicare Other

## 2023-03-31 DIAGNOSIS — C61 Malignant neoplasm of prostate: Secondary | ICD-10-CM

## 2023-04-02 LAB — PSA: Prostate Specific Ag, Serum: 9.6 ng/mL — ABNORMAL HIGH (ref 0.0–4.0)

## 2023-04-03 ENCOUNTER — Encounter: Payer: Medicare Other | Admitting: Internal Medicine

## 2023-04-06 NOTE — Progress Notes (Signed)
History of Present Illness: Here for f/u of PCa.  Prior to 1st visit in 2014, he had had 2 prior TRUS/Bxies by Dr. Rito Ehrlich, in 2004 in 2009. A   ll cores were negative by report. His PSAs ran between 7 and 9. At 1st visit PCA 3 test was 40/positive. Repeat TRUS/Bx 3.18.2014. 2/12 cores were positive for adenocarcinoma, both cores only had 5% involvement with GS 3+3 adenocarcinoma. Positive cores were  left base lateral and left mid lateral. Atypia found at the right mid medial biopsy. Prostatic volume was 42 cc. He elected AS.   Prior to enrolling him in active surveillance strategy, he underwent endorectal coil MRI. This revealed findings consistent with adenocarcinoma the left mid and lateral base. There was no evidence of extracapsular abnormality. There is no evidence of right-sided or apical disease.    4.14.2015: He underwent surveillance TRUS/Bx.1/12 cores showed adenocarcinoma of the prostate-GS 3+3, 10% of that core, at the left base lateral, was positive.  Oncotype DX/GPS result--32 consistent with 68% chance of favorable pathology.  81% likelihood of low-grade disease  76% chance of organ confined disease    7.12.2019: MRI prostate. Volume 87 ml. No evidence of transcapsular spread/SV involvement/NV bundle involvement/pelvic adenopathy/bony mets. PIRADS 2-3 lesion medial left base   8.19.2019:  fusion Bx. Prostate volume 89.38 ml. All cores taken, including ROI cores were negative for PCa.  2.25.2020: PSA 8.6 9.4.2020: PSA 10.0 2.16.2021: PSA 14.3 5.28.2021: 10.1 12.6.2022: PSA 9.8.   6.6.2023: PSA 13.3.   6.11.2024: Recent PSA 9.6.  IPSS 4, Q OL 1.  No real urologic issues since his last visit.   Past Medical History:  Diagnosis Date   Arthritis    GERD (gastroesophageal reflux disease)    Hyperlipidemia    Hypertension    Presence of permanent cardiac pacemaker    Prostate cancer ([facility])    Syncope    recurrent, unexplained,  implantable loop recorder placed    Past  Surgical History:  Procedure Laterality Date   INGUINAL HERNIA REPAIR Left    INGUINAL HERNIA REPAIR Right 03/03/2018   Procedure: HERNIA REPAIR INGUINAL ADULT WITH MESH;  Surgeon: Franky Macho, MD;  Location: [facility];  Service: General;  Laterality: Right;   KNEE ARTHROSCOPY Left    LOOP RECORDER INSERTION N/A 03/16/2017   MDT LINQ implanted for recurrent unexplained syncope   LOOP RECORDER REMOVAL  12/22/2017   ILR removed by Dr Johney Frame   PACEMAKER IMPLANT N/A 12/22/2017   MDT [place] Jansky XT DR MRI conditional PPM implanted by Dr Johney Frame for syncope and bradycardia    Home Medications:  Allergies as of 04/07/2023       Reactions   Metoprolol    bradycardia   Other Other (See Comments)   "pain medication thinks its hydrocodone - made me pass out"   Oxycodone Other (See Comments)   Eyes rolled back in head        Medication List        Accurate as of April 06, 2023 11:01 AM. If you have any questions, ask your nurse or doctor.          amLODipine 5 MG tablet Commonly known as: NORVASC Take 5 mg by mouth daily.   atorvastatin 20 MG tablet Commonly known as: LIPITOR Take 20 mg by mouth every evening.   famotidine 20 MG tablet Commonly known as: PEPCID Take 20 mg by mouth every evening.   NASOGEL NA Place 1 spray into the nose 2 (two) times daily.  NeilMed Nasogel   omeprazole 20 MG capsule Commonly known as: PRILOSEC Take 20 mg by mouth daily.        Allergies:  Allergies  Allergen Reactions   Metoprolol     bradycardia   Other Other (See Comments)    "pain medication thinks its hydrocodone - made me pass out"   Oxycodone Other (See Comments)    Eyes rolled back in head    Family History  Problem Relation Age of Onset   Aneurysm Mother    Heart failure Father     Social History:  reports that he has never smoked. He has never used smokeless tobacco. He reports that he does not drink alcohol and does not use drugs.  ROS: A complete review of systems was  performed.  All systems are negative except for pertinent findings as noted.  Physical Exam:  Vital signs in last 24 hours: There were no vitals taken for this visit. Constitutional:  Alert and oriented, No acute distress Cardiovascular: Regular rate  Respiratory: Normal respiratory effort Neurologic: Grossly intact, no focal deficits Psychiatric: Normal mood and affect  I have reviewed prior pt notes  I have reviewed urinalysis results  I have independently reviewed prior imaging--MRI, ultrasound imaging  I have reviewed prior PSA and pathology results  I have reviewed prior urine culture   Impression/Assessment:  Grade group 1 prostate cancer, on active surveillance, stable PSA, normal DRE, it has been sometime since he has had an MRI  BPH, large gland but relatively asymptomatic  Plan:  I will have him come back in a year following PSA.  Consider repeat MRI at some point in the future

## 2023-04-07 ENCOUNTER — Ambulatory Visit (INDEPENDENT_AMBULATORY_CARE_PROVIDER_SITE_OTHER): Payer: Medicare Other | Admitting: Urology

## 2023-04-07 ENCOUNTER — Encounter: Payer: Self-pay | Admitting: Urology

## 2023-04-07 VITALS — BP 133/81 | HR 79

## 2023-04-07 DIAGNOSIS — N400 Enlarged prostate without lower urinary tract symptoms: Secondary | ICD-10-CM | POA: Diagnosis not present

## 2023-04-07 DIAGNOSIS — C61 Malignant neoplasm of prostate: Secondary | ICD-10-CM | POA: Diagnosis not present

## 2023-04-07 LAB — URINALYSIS, ROUTINE W REFLEX MICROSCOPIC
Bilirubin, UA: NEGATIVE
Glucose, UA: NEGATIVE
Leukocytes,UA: NEGATIVE
Nitrite, UA: NEGATIVE
Protein,UA: NEGATIVE
RBC, UA: NEGATIVE
Specific Gravity, UA: 1.025 (ref 1.005–1.030)
Urobilinogen, Ur: 0.2 mg/dL (ref 0.2–1.0)
pH, UA: 5.5 (ref 5.0–7.5)

## 2023-04-10 ENCOUNTER — Encounter: Payer: Self-pay | Admitting: Cardiovascular Disease

## 2023-04-10 ENCOUNTER — Ambulatory Visit: Payer: Medicare Other | Attending: Internal Medicine | Admitting: Cardiovascular Disease

## 2023-04-10 VITALS — BP 132/76 | HR 64 | Ht 71.0 in | Wt 196.0 lb

## 2023-04-10 DIAGNOSIS — R001 Bradycardia, unspecified: Secondary | ICD-10-CM
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
## 2023-04-10 NOTE — Patient Instructions (Signed)

## 2023-05-01 ENCOUNTER — Encounter: Payer: Medicare Other | Admitting: Cardiovascular Disease

## 2023-05-18 ENCOUNTER — Ambulatory Visit (INDEPENDENT_AMBULATORY_CARE_PROVIDER_SITE_OTHER): Payer: Medicare Other

## 2023-05-18 DIAGNOSIS — I495 Sick sinus syndrome: Secondary | ICD-10-CM | POA: Diagnosis not present

## 2023-05-19 LAB — CUP PACEART REMOTE DEVICE CHECK
Battery Remaining Longevity: 121 mo
Battery Voltage: 3.01 V
Brady Statistic AP VP Percent: 0.01 %
Brady Statistic AP VS Percent: 2.37 %
Brady Statistic AS VP Percent: 0.05 %
Brady Statistic AS VS Percent: 97.58 %
Brady Statistic RA Percent Paced: 2.53 %
Brady Statistic RV Percent Paced: 0.05 %
Date Time Interrogation Session: 20240722025823
Implantable Lead Connection Status: 753985
Implantable Lead Connection Status: 753985
Implantable Lead Implant Date: 20190226
Implantable Lead Implant Date: 20190226
Implantable Lead Location: 753859
Implantable Lead Location: 753860
Implantable Lead Model: 5076
Implantable Lead Model: 5076
Implantable Pulse Generator Implant Date: 20190226
Lead Channel Impedance Value: 342 Ohm
Lead Channel Impedance Value: 399 Ohm
Lead Channel Impedance Value: 418 Ohm
Lead Channel Impedance Value: 475 Ohm
Lead Channel Pacing Threshold Amplitude: 0.5 V
Lead Channel Pacing Threshold Amplitude: 0.625 V
Lead Channel Pacing Threshold Pulse Width: 0.4 ms
Lead Channel Pacing Threshold Pulse Width: 0.4 ms
Lead Channel Sensing Intrinsic Amplitude: 12.75 mV
Lead Channel Sensing Intrinsic Amplitude: 12.75 mV
Lead Channel Sensing Intrinsic Amplitude: 2.75 mV
Lead Channel Sensing Intrinsic Amplitude: 2.75 mV
Lead Channel Setting Pacing Amplitude: 1.5 V
Lead Channel Setting Pacing Amplitude: 2.5 V
Lead Channel Setting Pacing Pulse Width: 0.4 ms
Lead Channel Setting Sensing Sensitivity: 1.2 mV
Zone Setting Status: 755011
Zone Setting Status: 755011

## 2023-05-28 NOTE — Progress Notes (Signed)
Remote pacemaker transmission.   

## 2023-08-17 ENCOUNTER — Ambulatory Visit: Payer: Medicare Other

## 2023-08-17 DIAGNOSIS — I495 Sick sinus syndrome: Secondary | ICD-10-CM | POA: Diagnosis not present

## 2023-08-18 LAB — CUP PACEART REMOTE DEVICE CHECK
Battery Remaining Longevity: 118 mo
Battery Voltage: 3.01 V
Brady Statistic AP VP Percent: 0.01 %
Brady Statistic AP VS Percent: 8.5 %
Brady Statistic AS VP Percent: 0.05 %
Brady Statistic AS VS Percent: 91.45 %
Brady Statistic RA Percent Paced: 8.67 %
Brady Statistic RV Percent Paced: 0.06 %
Date Time Interrogation Session: 20241021014828
Implantable Lead Connection Status: 753985
Implantable Lead Connection Status: 753985
Implantable Lead Implant Date: 20190226
Implantable Lead Implant Date: 20190226
Implantable Lead Location: 753859
Implantable Lead Location: 753860
Implantable Lead Model: 5076
Implantable Lead Model: 5076
Implantable Pulse Generator Implant Date: 20190226
Lead Channel Impedance Value: 342 Ohm
Lead Channel Impedance Value: 418 Ohm
Lead Channel Impedance Value: 418 Ohm
Lead Channel Impedance Value: 494 Ohm
Lead Channel Pacing Threshold Amplitude: 0.5 V
Lead Channel Pacing Threshold Amplitude: 0.625 V
Lead Channel Pacing Threshold Pulse Width: 0.4 ms
Lead Channel Pacing Threshold Pulse Width: 0.4 ms
Lead Channel Sensing Intrinsic Amplitude: 13 mV
Lead Channel Sensing Intrinsic Amplitude: 13 mV
Lead Channel Sensing Intrinsic Amplitude: 3.25 mV
Lead Channel Sensing Intrinsic Amplitude: 3.25 mV
Lead Channel Setting Pacing Amplitude: 1.5 V
Lead Channel Setting Pacing Amplitude: 2.5 V
Lead Channel Setting Pacing Pulse Width: 0.4 ms
Lead Channel Setting Sensing Sensitivity: 1.2 mV
Zone Setting Status: 755011
Zone Setting Status: 755011

## 2023-09-02 NOTE — Progress Notes (Signed)
Remote pacemaker transmission.   

## 2023-11-16 ENCOUNTER — Ambulatory Visit (INDEPENDENT_AMBULATORY_CARE_PROVIDER_SITE_OTHER): Payer: Medicare Other

## 2023-11-16 DIAGNOSIS — I495 Sick sinus syndrome: Secondary | ICD-10-CM | POA: Diagnosis not present

## 2023-11-17 LAB — CUP PACEART REMOTE DEVICE CHECK
Battery Remaining Longevity: 116 mo
Battery Voltage: 3.01 V
Brady Statistic AP VP Percent: 0.01 %
Brady Statistic AP VS Percent: 4.68 %
Brady Statistic AS VP Percent: 0.04 %
Brady Statistic AS VS Percent: 95.27 %
Brady Statistic RA Percent Paced: 4.63 %
Brady Statistic RV Percent Paced: 0.05 %
Date Time Interrogation Session: 20250121004430
Implantable Lead Connection Status: 753985
Implantable Lead Connection Status: 753985
Implantable Lead Implant Date: 20190226
Implantable Lead Implant Date: 20190226
Implantable Lead Location: 753859
Implantable Lead Location: 753860
Implantable Lead Model: 5076
Implantable Lead Model: 5076
Implantable Pulse Generator Implant Date: 20190226
Lead Channel Impedance Value: 323 Ohm
Lead Channel Impedance Value: 399 Ohm
Lead Channel Impedance Value: 399 Ohm
Lead Channel Impedance Value: 456 Ohm
Lead Channel Pacing Threshold Amplitude: 0.625 V
Lead Channel Pacing Threshold Amplitude: 0.625 V
Lead Channel Pacing Threshold Pulse Width: 0.4 ms
Lead Channel Pacing Threshold Pulse Width: 0.4 ms
Lead Channel Sensing Intrinsic Amplitude: 12.125 mV
Lead Channel Sensing Intrinsic Amplitude: 12.125 mV
Lead Channel Sensing Intrinsic Amplitude: 2.75 mV
Lead Channel Sensing Intrinsic Amplitude: 2.75 mV
Lead Channel Setting Pacing Amplitude: 1.5 V
Lead Channel Setting Pacing Amplitude: 2.5 V
Lead Channel Setting Pacing Pulse Width: 0.4 ms
Lead Channel Setting Sensing Sensitivity: 1.2 mV
Zone Setting Status: 755011
Zone Setting Status: 755011

## 2023-11-21 ENCOUNTER — Encounter: Payer: Self-pay | Admitting: Cardiovascular Disease

## 2023-12-24 NOTE — Progress Notes (Signed)
 Remote pacemaker transmission.

## 2023-12-24 NOTE — Addendum Note (Signed)
 Addended by: Geralyn Flash D on: 12/24/2023 12:32 PM   Modules accepted: Orders

## 2024-02-15 ENCOUNTER — Ambulatory Visit (INDEPENDENT_AMBULATORY_CARE_PROVIDER_SITE_OTHER): Payer: Medicare Other

## 2024-02-15 DIAGNOSIS — I495 Sick sinus syndrome: Secondary | ICD-10-CM

## 2024-02-16 LAB — CUP PACEART REMOTE DEVICE CHECK
Battery Remaining Longevity: 115 mo
Battery Voltage: 3.01 V
Brady Statistic AP VP Percent: 0.01 %
Brady Statistic AP VS Percent: 3.04 %
Brady Statistic AS VP Percent: 0.04 %
Brady Statistic AS VS Percent: 96.91 %
Brady Statistic RA Percent Paced: 3.01 %
Brady Statistic RV Percent Paced: 0.05 %
Date Time Interrogation Session: 20250421014558
Implantable Lead Connection Status: 753985
Implantable Lead Connection Status: 753985
Implantable Lead Implant Date: 20190226
Implantable Lead Implant Date: 20190226
Implantable Lead Location: 753859
Implantable Lead Location: 753860
Implantable Lead Model: 5076
Implantable Lead Model: 5076
Implantable Pulse Generator Implant Date: 20190226
Lead Channel Impedance Value: 342 Ohm
Lead Channel Impedance Value: 380 Ohm
Lead Channel Impedance Value: 399 Ohm
Lead Channel Impedance Value: 418 Ohm
Lead Channel Pacing Threshold Amplitude: 0.5 V
Lead Channel Pacing Threshold Amplitude: 0.5 V
Lead Channel Pacing Threshold Pulse Width: 0.4 ms
Lead Channel Pacing Threshold Pulse Width: 0.4 ms
Lead Channel Sensing Intrinsic Amplitude: 11.625 mV
Lead Channel Sensing Intrinsic Amplitude: 11.625 mV
Lead Channel Sensing Intrinsic Amplitude: 2.5 mV
Lead Channel Sensing Intrinsic Amplitude: 2.5 mV
Lead Channel Setting Pacing Amplitude: 1.5 V
Lead Channel Setting Pacing Amplitude: 2.5 V
Lead Channel Setting Pacing Pulse Width: 0.4 ms
Lead Channel Setting Sensing Sensitivity: 1.2 mV
Zone Setting Status: 755011
Zone Setting Status: 755011

## 2024-02-25 ENCOUNTER — Encounter: Payer: Self-pay | Admitting: Cardiovascular Disease

## 2024-03-28 ENCOUNTER — Other Ambulatory Visit: Payer: Self-pay

## 2024-03-28 DIAGNOSIS — C61 Malignant neoplasm of prostate: Secondary | ICD-10-CM

## 2024-03-29 LAB — PSA: Prostate Specific Ag, Serum: 12.6 ng/mL — ABNORMAL HIGH (ref 0.0–4.0)

## 2024-03-30 ENCOUNTER — Ambulatory Visit: Payer: Self-pay | Admitting: Urology

## 2024-04-01 ENCOUNTER — Ambulatory Visit: Payer: Medicare Other | Attending: Cardiovascular Disease | Admitting: Cardiovascular Disease

## 2024-04-01 ENCOUNTER — Encounter: Payer: Self-pay | Admitting: Cardiovascular Disease

## 2024-04-01 VITALS — BP 132/74 | HR 64 | Ht 70.0 in | Wt 191.8 lb

## 2024-04-01 DIAGNOSIS — R001 Bradycardia, unspecified: Secondary | ICD-10-CM | POA: Diagnosis present

## 2024-04-01 LAB — CUP PACEART INCLINIC DEVICE CHECK
Date Time Interrogation Session: 20250606165343
Implantable Lead Connection Status: 753985
Implantable Lead Connection Status: 753985
Implantable Lead Implant Date: 20190226
Implantable Lead Implant Date: 20190226
Implantable Lead Location: 753859
Implantable Lead Location: 753860
Implantable Lead Model: 5076
Implantable Lead Model: 5076
Implantable Pulse Generator Implant Date: 20190226

## 2024-04-01 NOTE — Patient Instructions (Signed)
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
## 2024-04-04 NOTE — Progress Notes (Signed)
 Remote pacemaker transmission.

## 2024-04-04 NOTE — Addendum Note (Signed)
 Addended by: Edra Govern D on: 04/04/2024 10:37 AM   Modules accepted: Orders

## 2024-04-05 ENCOUNTER — Ambulatory Visit: Payer: Self-pay | Admitting: Cardiovascular Disease

## 2024-05-16 ENCOUNTER — Ambulatory Visit: Payer: Medicare Other

## 2024-05-16 DIAGNOSIS — I495 Sick sinus syndrome: Secondary | ICD-10-CM

## 2024-05-16 NOTE — Progress Notes (Signed)
 Impression/Assessment:  Grade group 1 prostate cancer, on active surveillance, stable PSA, normal DRE, it has been sometime since he has had an MRI but his PSA curve is stable, DRE benign  BPH, large gland but relatively asymptomatic  Plan:  Continue same yearly visit with PSA.  At this point I do not think he needs another MRI or other diagnostic testing unless PSA curve changes  History of Present Illness: Here for f/u of PCa.  Prior to 1st visit in 2014, he had had 2 prior TRUS/Bxies by Dr. Verdene, in 2004 in 2009. A   ll cores were negative by report. His PSAs ran between 7 and 9. At 1st visit PCA 3 test was 40/positive. Repeat TRUS/Bx 3.18.2014. 2/12 cores were positive for adenocarcinoma, both cores only had 5% involvement with GS 3+3 adenocarcinoma. Positive cores were  left base lateral and left mid lateral. Atypia found at the right mid medial biopsy. Prostatic volume was 42 cc. He elected AS.   Prior to enrolling him in active surveillance strategy, he underwent endorectal coil MRI. This revealed findings consistent with adenocarcinoma the left mid and lateral base. There was no evidence of extracapsular abnormality. There is no evidence of right-sided or apical disease.    4.14.2015: He underwent surveillance TRUS/Bx.1/12 cores showed adenocarcinoma of the prostate-GS 3+3, 10% of that core, at the left base lateral, was positive.  Oncotype DX/GPS result--32 consistent with 68% chance of favorable pathology.  81% likelihood of low-grade disease  76% chance of organ confined disease    7.12.2019: MRI prostate. Volume 87 ml. No evidence of transcapsular spread/SV involvement/NV bundle involvement/pelvic adenopathy/bony mets. PIRADS 2-3 lesion medial left base   8.19.2019:  fusion Bx. Prostate volume 89.38 ml. All cores taken, including ROI cores were negative for PCa.  2.25.2020: PSA 8.6 9.4.2020: PSA 10.0 2.16.2021: PSA 14.3 5.28.2021: 10.1 12.6.2022: PSA 9.8.   6.6.2023:  PSA 13.3.  6.11.2024: PSA 9.6.  7.22.2025: Recent PSA 12.6.  He has been voiding well.  No specific urologic complaints. Past Medical History:  Diagnosis Date   Arthritis    GERD (gastroesophageal reflux disease)    Hyperlipidemia    Hypertension    Presence of permanent cardiac pacemaker    Prostate cancer (HCC)    Syncope    recurrent, unexplained,  implantable loop recorder placed    Past Surgical History:  Procedure Laterality Date   INGUINAL HERNIA REPAIR Left    INGUINAL HERNIA REPAIR Right 03/03/2018   Procedure: HERNIA REPAIR INGUINAL ADULT WITH MESH;  Surgeon: Mavis Anes, MD;  Location: [facility];  Service: General;  Laterality: Right;   KNEE ARTHROSCOPY Left    LOOP RECORDER INSERTION N/A 03/16/2017   MDT LINQ implanted for recurrent unexplained syncope   LOOP RECORDER REMOVAL  12/22/2017   ILR removed by Dr Kelsie   PACEMAKER IMPLANT N/A 12/22/2017   MDT [place] XT DR MRI conditional PPM implanted by Dr Kelsie for syncope and bradycardia    Home Medications:  Allergies as of 05/17/2024       Reactions   Metoprolol    bradycardia   Other Other (See Comments)   pain medication thinks its hydrocodone - made me pass out   Oxycodone Other (See Comments)   Eyes rolled back in head        Medication List        Accurate as of May 16, 2024 12:03 PM. If you have any questions, ask your nurse or doctor.  amLODipine  5 MG tablet Commonly known as: NORVASC  Take 5 mg by mouth daily.   atorvastatin 20 MG tablet Commonly known as: LIPITOR Take 20 mg by mouth every evening.   famotidine 20 MG tablet Commonly known as: PEPCID Take 20 mg by mouth every evening.   NASOGEL NA Place 1 spray into the nose 2 (two) times daily. NeilMed Nasogel   omeprazole 20 MG capsule Commonly known as: PRILOSEC Take 20 mg by mouth daily.        Allergies:  Allergies  Allergen Reactions   Metoprolol     bradycardia   Other Other (See Comments)    pain  medication thinks its hydrocodone - made me pass out   Oxycodone Other (See Comments)    Eyes rolled back in head    Family History  Problem Relation Age of Onset   Aneurysm Mother    Heart failure Father     Social History:  reports that he has never smoked. He has never used smokeless tobacco. He reports that he does not drink alcohol and does not use drugs.  ROS: A complete review of systems was performed.  All systems are negative except for pertinent findings as noted.  Physical Exam:  Vital signs in last 24 hours: There were no vitals taken for this visit. Constitutional:  Alert and oriented, No acute distress Cardiovascular: Regular rate  Respiratory: Normal respiratory effort GU: Normal anal sphincter tone.  Prostate 100 g, symmetric.  Nonnodular, nontender.  No rectal masses. Neurologic: Grossly intact, no focal deficits Psychiatric: Normal mood and affect  I have reviewed prior pt notes  I have reviewed urinalysis results  I have independently reviewed prior imaging--MRI, ultrasound imaging  I have reviewed prior PSA and pathology results

## 2024-05-17 ENCOUNTER — Encounter: Payer: Self-pay | Admitting: Urology

## 2024-05-17 ENCOUNTER — Ambulatory Visit (INDEPENDENT_AMBULATORY_CARE_PROVIDER_SITE_OTHER): Admitting: Urology

## 2024-05-17 VITALS — BP 151/81 | HR 71

## 2024-05-17 DIAGNOSIS — C61 Malignant neoplasm of prostate: Secondary | ICD-10-CM

## 2024-05-17 DIAGNOSIS — N400 Enlarged prostate without lower urinary tract symptoms: Secondary | ICD-10-CM

## 2024-05-17 LAB — CUP PACEART REMOTE DEVICE CHECK
Battery Remaining Longevity: 113 mo
Battery Voltage: 3 V
Brady Statistic AP VP Percent: 0.01 %
Brady Statistic AP VS Percent: 9.38 %
Brady Statistic AS VP Percent: 0.04 %
Brady Statistic AS VS Percent: 90.57 %
Brady Statistic RA Percent Paced: 9.32 %
Brady Statistic RV Percent Paced: 0.05 %
Date Time Interrogation Session: 20250721014623
Implantable Lead Connection Status: 753985
Implantable Lead Connection Status: 753985
Implantable Lead Implant Date: 20190226
Implantable Lead Implant Date: 20190226
Implantable Lead Location: 753859
Implantable Lead Location: 753860
Implantable Lead Model: 5076
Implantable Lead Model: 5076
Implantable Pulse Generator Implant Date: 20190226
Lead Channel Impedance Value: 342 Ohm
Lead Channel Impedance Value: 399 Ohm
Lead Channel Impedance Value: 399 Ohm
Lead Channel Impedance Value: 437 Ohm
Lead Channel Pacing Threshold Amplitude: 0.5 V
Lead Channel Pacing Threshold Amplitude: 0.625 V
Lead Channel Pacing Threshold Pulse Width: 0.4 ms
Lead Channel Pacing Threshold Pulse Width: 0.4 ms
Lead Channel Sensing Intrinsic Amplitude: 12.125 mV
Lead Channel Sensing Intrinsic Amplitude: 12.125 mV
Lead Channel Sensing Intrinsic Amplitude: 2.5 mV
Lead Channel Sensing Intrinsic Amplitude: 2.5 mV
Lead Channel Setting Pacing Amplitude: 1.5 V
Lead Channel Setting Pacing Amplitude: 2.5 V
Lead Channel Setting Pacing Pulse Width: 0.4 ms
Lead Channel Setting Sensing Sensitivity: 1.2 mV
Zone Setting Status: 755011
Zone Setting Status: 755011

## 2024-05-18 LAB — URINALYSIS, ROUTINE W REFLEX MICROSCOPIC
Bilirubin, UA: NEGATIVE
Glucose, UA: NEGATIVE
Ketones, UA: NEGATIVE
Leukocytes,UA: NEGATIVE
Nitrite, UA: NEGATIVE
Protein,UA: NEGATIVE
RBC, UA: NEGATIVE
Specific Gravity, UA: 1.025 (ref 1.005–1.030)
Urobilinogen, Ur: 1 mg/dL (ref 0.2–1.0)
pH, UA: 6 (ref 5.0–7.5)

## 2024-05-22 ENCOUNTER — Ambulatory Visit: Payer: Self-pay | Admitting: Cardiovascular Disease

## 2024-08-01 NOTE — Progress Notes (Signed)
 Remote PPM Transmission

## 2024-08-01 NOTE — Progress Notes (Deleted)
 Remote Loop Recorder Transmission

## 2024-08-15 ENCOUNTER — Ambulatory Visit (INDEPENDENT_AMBULATORY_CARE_PROVIDER_SITE_OTHER): Payer: Medicare Other

## 2024-08-15 DIAGNOSIS — I495 Sick sinus syndrome: Secondary | ICD-10-CM

## 2024-08-16 LAB — CUP PACEART REMOTE DEVICE CHECK
Battery Remaining Longevity: 111 mo
Battery Voltage: 3 V
Brady Statistic AP VP Percent: 0.01 %
Brady Statistic AP VS Percent: 11.86 %
Brady Statistic AS VP Percent: 0.04 %
Brady Statistic AS VS Percent: 88.08 %
Brady Statistic RA Percent Paced: 11.73 %
Brady Statistic RV Percent Paced: 0.06 %
Date Time Interrogation Session: 20251020014821
Implantable Lead Connection Status: 753985
Implantable Lead Connection Status: 753985
Implantable Lead Implant Date: 20190226
Implantable Lead Implant Date: 20190226
Implantable Lead Location: 753859
Implantable Lead Location: 753860
Implantable Lead Model: 5076
Implantable Lead Model: 5076
Implantable Pulse Generator Implant Date: 20190226
Lead Channel Impedance Value: 342 Ohm
Lead Channel Impedance Value: 418 Ohm
Lead Channel Impedance Value: 437 Ohm
Lead Channel Impedance Value: 494 Ohm
Lead Channel Pacing Threshold Amplitude: 0.5 V
Lead Channel Pacing Threshold Amplitude: 0.625 V
Lead Channel Pacing Threshold Pulse Width: 0.4 ms
Lead Channel Pacing Threshold Pulse Width: 0.4 ms
Lead Channel Sensing Intrinsic Amplitude: 13.125 mV
Lead Channel Sensing Intrinsic Amplitude: 13.125 mV
Lead Channel Sensing Intrinsic Amplitude: 2.375 mV
Lead Channel Sensing Intrinsic Amplitude: 2.375 mV
Lead Channel Setting Pacing Amplitude: 1.5 V
Lead Channel Setting Pacing Amplitude: 2.5 V
Lead Channel Setting Pacing Pulse Width: 0.4 ms
Lead Channel Setting Sensing Sensitivity: 1.2 mV
Zone Setting Status: 755011
Zone Setting Status: 755011

## 2024-08-19 NOTE — Progress Notes (Signed)
 Remote PPM Transmission

## 2024-08-22 ENCOUNTER — Ambulatory Visit: Payer: Self-pay | Admitting: Cardiovascular Disease

## 2024-11-14 ENCOUNTER — Ambulatory Visit: Payer: Medicare Other

## 2024-11-14 DIAGNOSIS — I495 Sick sinus syndrome: Secondary | ICD-10-CM

## 2024-11-16 LAB — CUP PACEART REMOTE DEVICE CHECK
Battery Remaining Longevity: 110 mo
Battery Voltage: 3 V
Brady Statistic AP VP Percent: 0.01 %
Brady Statistic AP VS Percent: 7.78 %
Brady Statistic AS VP Percent: 0.04 %
Brady Statistic AS VS Percent: 92.18 %
Brady Statistic RA Percent Paced: 7.67 %
Brady Statistic RV Percent Paced: 0.05 %
Date Time Interrogation Session: 20260119010026
Implantable Lead Connection Status: 753985
Implantable Lead Connection Status: 753985
Implantable Lead Implant Date: 20190226
Implantable Lead Implant Date: 20190226
Implantable Lead Location: 753859
Implantable Lead Location: 753860
Implantable Lead Model: 5076
Implantable Lead Model: 5076
Implantable Pulse Generator Implant Date: 20190226
Lead Channel Impedance Value: 323 Ohm
Lead Channel Impedance Value: 399 Ohm
Lead Channel Impedance Value: 399 Ohm
Lead Channel Impedance Value: 437 Ohm
Lead Channel Pacing Threshold Amplitude: 0.5 V
Lead Channel Pacing Threshold Amplitude: 0.5 V
Lead Channel Pacing Threshold Pulse Width: 0.4 ms
Lead Channel Pacing Threshold Pulse Width: 0.4 ms
Lead Channel Sensing Intrinsic Amplitude: 12.5 mV
Lead Channel Sensing Intrinsic Amplitude: 12.5 mV
Lead Channel Sensing Intrinsic Amplitude: 2.5 mV
Lead Channel Sensing Intrinsic Amplitude: 2.5 mV
Lead Channel Setting Pacing Amplitude: 1.5 V
Lead Channel Setting Pacing Amplitude: 2.5 V
Lead Channel Setting Pacing Pulse Width: 0.4 ms
Lead Channel Setting Sensing Sensitivity: 1.2 mV
Zone Setting Status: 755011
Zone Setting Status: 755011

## 2024-11-18 NOTE — Progress Notes (Signed)
 Remote PPM Transmission

## 2024-11-22 ENCOUNTER — Ambulatory Visit: Payer: Self-pay | Admitting: Cardiovascular Disease

## 2025-02-13 ENCOUNTER — Ambulatory Visit

## 2025-05-15 ENCOUNTER — Ambulatory Visit

## 2025-05-16 ENCOUNTER — Other Ambulatory Visit

## 2025-05-23 ENCOUNTER — Ambulatory Visit: Admitting: Urology

## 2025-08-14 ENCOUNTER — Ambulatory Visit

## 2025-11-13 ENCOUNTER — Ambulatory Visit
